# Patient Record
Sex: Female | Born: 1965 | Hispanic: Yes | Marital: Married | Smoking: Never smoker
Health system: Southern US, Community
[De-identification: ages and names within clinical notes are randomized; demographics above are authoritative.]

## PROBLEM LIST (undated history)

## (undated) DIAGNOSIS — R42 Dizziness and giddiness: Secondary | ICD-10-CM

## (undated) DIAGNOSIS — M1990 Unspecified osteoarthritis, unspecified site: Secondary | ICD-10-CM

## (undated) DIAGNOSIS — F32A Depression, unspecified: Secondary | ICD-10-CM

## (undated) DIAGNOSIS — G43909 Migraine, unspecified, not intractable, without status migrainosus: Secondary | ICD-10-CM

## (undated) HISTORY — DX: Unspecified osteoarthritis, unspecified site: M19.90

## (undated) HISTORY — DX: Depression, unspecified: F32.A

## (undated) HISTORY — PX: APPENDECTOMY: SHX54

---

## 2016-08-18 ENCOUNTER — Ambulatory Visit
Admission: EM | Admit: 2016-08-18 | Discharge: 2016-08-18 | Discharge status: Absent without leave | Payer: Medicaid Other

## 2016-08-18 ENCOUNTER — Encounter: Payer: Self-pay | Admitting: *Deleted

## 2016-08-18 HISTORY — DX: Migraine, unspecified, not intractable, without status migrainosus: G43.909

## 2016-08-18 HISTORY — DX: Dizziness and giddiness: R42

## 2016-08-18 NOTE — ED Triage Notes (Signed)
Patient started having symptoms of cough, body aches, and blisters on mouth 2 days ago.

## 2016-08-19 ENCOUNTER — Ambulatory Visit
Admission: EM | Admit: 2016-08-19 | Discharge: 2016-08-19 | Disposition: A | Discharge status: Home / Self Care | Payer: Medicaid Other | Attending: Family Medicine | Admitting: Family Medicine

## 2016-08-19 DIAGNOSIS — B001 Herpesviral vesicular dermatitis: Secondary | ICD-10-CM | POA: Diagnosis not present

## 2016-08-19 DIAGNOSIS — J069 Acute upper respiratory infection, unspecified: Secondary | ICD-10-CM | POA: Diagnosis not present

## 2016-08-19 MED ORDER — HYDROCOD POLST-CPM POLST ER 10-8 MG/5ML PO SUER: 5.0000 mL | Freq: Two times a day (BID) | ORAL | 0 refills | Status: DC

## 2016-08-19 MED ORDER — VALACYCLOVIR HCL 1 G PO TABS: 2000.0000 mg | ORAL_TABLET | Freq: Two times a day (BID) | ORAL | 0 refills | Status: AC

## 2016-08-19 NOTE — ED Triage Notes (Signed)
Pt c/o cough for 3 days, and mouth lesions.

## 2016-08-19 NOTE — ED Provider Notes (Signed)
CSN: [id number]     Arrival date & time 08/19/16  7564 History   First MD Initiated Contact with Patient 08/19/16 1028     Chief Complaint  Patient presents with  . Cough   (Consider location/radiation/quality/duration/timing/severity/associated sxs/prior Treatment) HPI  This a 51 year old female who presents with a  cough headache chills x 3 days. His noticed some "high back" pain when coughing. Addition she has noticed some cold sores are involving both the upper and lower lips. She states that after using Carmex lotion she had swelling of her lips with development of the sores      Past Medical History:  Diagnosis Date  . Migraine   . Vertigo    Past Surgical History:  Procedure Laterality Date  . APPENDECTOMY     Family History  Problem Relation Age of Onset  . Cancer Mother   . Cancer Father    Social History  Substance Use Topics  . Smoking status: Never Smoker  . Smokeless tobacco: Never Used  . Alcohol use No   OB History    No data available     Review of Systems  Constitutional: Positive for activity change. Negative for chills, fatigue and fever.  HENT: Positive for congestion and mouth sores.   Respiratory: Positive for cough. Negative for shortness of breath, wheezing and stridor.   All other systems reviewed and are negative.   Allergies  Patient has no known allergies.  Home Medications   Prior to Admission medications   Medication Sig Start Date End Date Taking? Authorizing Provider  chlorpheniramine-HYDROcodone (TUSSIONEX PENNKINETIC ER) 10-8 MG/5ML SUER Take 5 mLs by mouth 2 (two) times daily. 08/19/16   Lutricia Feil, PA-C  valACYclovir (VALTREX) 1000 MG tablet Take 2 tablets (2,000 mg total) by mouth 2 (two) times daily. For 1 day only 08/19/16 09/02/16  Lutricia Feil, PA-C   Meds Ordered and Administered this Visit  Medications - No data to display  BP 114/80 (BP Location: Left Arm)   Pulse 91   Temp 98.4 F (36.9 C) (Oral)   Resp  18   Ht 5\' 2"  (1.575 m)   Wt 220 lb (99.8 kg)   SpO2 100%   BMI 40.24 kg/m  No data found.   Physical Exam  Constitutional: She is oriented to person, place, and time. She appears well-developed and well-nourished. No distress.  HENT:  Head: Normocephalic and atraumatic.  Right Ear: External ear normal.  Left Ear: External ear normal.  Nose: Nose normal.  Mouth/Throat: Oropharynx is clear and moist. No oropharyngeal exudate.  Semination of the lips shows several vesicular lesions on an erythematous base. Is not Involve the vermilion border. There are several small ones on the upper lip and one confluent larger wound on the lower right. No discharge. Not open or draining.  Eyes: EOM are normal. Pupils are equal, round, and reactive to light. Right eye exhibits no discharge. Left eye exhibits no discharge.  Pulmonary/Chest: Effort normal. No respiratory distress. She has no wheezes. She has rales.  She has a few inspiratory rales in the right base  Musculoskeletal: Normal range of motion.  Neurological: She is alert and oriented to person, place, and time.  Skin: Skin is warm and dry. She is not diaphoretic.  Psychiatric: She has a normal mood and affect. Her behavior is normal. Judgment and thought content normal.  Nursing note and vitals reviewed.   Urgent Care Course     Procedures (including critical care time)  Labs Review Labs Reviewed - No data to display  Imaging Review No results found.   Visual Acuity Review  Right Eye Distance:   Left Eye Distance:   Bilateral Distance:    Right Eye Near:   Left Eye Near:    Bilateral Near:         MDM   1. Acute upper respiratory infection   2. Cold sore    Discharge Medication List as of 08/19/2016 10:48 AM    START taking these medications   Details  chlorpheniramine-HYDROcodone (TUSSIONEX PENNKINETIC ER) 10-8 MG/5ML SUER Take 5 mLs by mouth 2 (two) times daily., Starting Tue 08/19/2016, Print    valACYclovir  (VALTREX) 1000 MG tablet Take 2 tablets (2,000 mg total) by mouth 2 (two) times daily. For 1 day only, Starting Tue 08/19/2016, Until Tue 09/02/2016, Normal      Plan: 1. Test/x-ray results and diagnosis reviewed with patient 2. rx as per orders; risks, benefits, potential side effects reviewed with patient 3. Recommend supportive treatment with Rest and fluids. Advised her that this is likely a virus and does not require antibiotics. I have also advised her to stop using the Carmex as this may be causing a contact dermatitis on her lips. I will give her a prescription for Valtrex as some of the lesions appear to be a cold sore and may be beneficial. He should follow-up with primary care physician if she is not improving 4. F/u prn if symptoms worsen or don't improve     Lutricia FeilWilliam P Roemer, PA-C 08/19/16 [address]    William P Fort LoramieRoemer, [place] JerseyPA-C 08/19/16 1101

## 2016-09-08 ENCOUNTER — Ambulatory Visit
Admission: EM | Admit: 2016-09-08 | Discharge: 2016-09-08 | Disposition: A | Discharge status: Home / Self Care | Payer: Medicaid Other | Attending: Emergency Medicine | Admitting: Emergency Medicine

## 2016-09-08 ENCOUNTER — Encounter: Payer: Self-pay | Admitting: *Deleted

## 2016-09-08 DIAGNOSIS — J069 Acute upper respiratory infection, unspecified: Secondary | ICD-10-CM

## 2016-09-08 MED ORDER — BENZONATATE 200 MG PO CAPS: 200.0000 mg | ORAL_CAPSULE | Freq: Three times a day (TID) | ORAL | 0 refills | Status: DC | PRN

## 2016-09-08 MED ORDER — GUAIFENESIN-CODEINE 100-10 MG/5ML PO SYRP: 10.0000 mL | ORAL_SOLUTION | Freq: Four times a day (QID) | ORAL | 0 refills | Status: DC | PRN

## 2016-09-08 MED ORDER — GUAIFENESIN ER 600 MG PO TB12: 600.0000 mg | ORAL_TABLET | Freq: Two times a day (BID) | ORAL | 0 refills | Status: DC

## 2016-09-08 MED ORDER — IBUPROFEN 600 MG PO TABS: 600.0000 mg | ORAL_TABLET | Freq: Four times a day (QID) | ORAL | 0 refills | Status: DC | PRN

## 2016-09-08 MED ORDER — FLUTICASONE PROPIONATE 50 MCG/ACT NA SUSP: 2.0000 | Freq: Every day | NASAL | 0 refills | Status: DC

## 2016-09-08 NOTE — Discharge Instructions (Signed)
Take the medication as written.  You may take 600 mg of motrin with 1 gram of tylenol up to 4 times a day as needed for pain. This is an effective combination for pain.  Most of these infections are viral and do not need antibiotics unless you have a high fever, have had this for 10 days, or you get better and then get sick again. Use a neti pot or the NeilMed sinus rinse as often as you want to to reduce nasal congestion. Follow the directions on the box.   Go to www.goodrx.com to look up your medications. This will give you a list of where you can find your prescriptions at the most affordable prices.

## 2016-09-08 NOTE — ED Provider Notes (Signed)
HPI  SUBJECTIVE:  Amy Frey is a 51 y.o. female who presents with fevers tmax 102, cough productive of whitish phlegm, chest soreness from coughing, nasal congestion, clear rhinorrhea, postnasal drip, headaches, fatigue starting yesterday. No fevers today. She did take Tylenol 8 hours prior to evaluation. She denies body aches, ear pain, sinus pain or pressure, posttussive emesis, dental pain, sore throat.  She denies wheezing, chest pain, shortness of breath. No Neck stiffness, photophobia. No abdominal pain, urinary complaints. No skin infection. She tried Vicks vapo rub, Tylenol, ibuprofen over-the-counter cold medicines with improvement in her symptoms. No aggravating factors. She did not get a flu shot this year. She is not sure if she has had any contacts with the flu. She has a past medical history of migraines, vertigo. No history of lung disease, smoking, diabetes, hypertension, immunocompromise, cancer, kidney disease.. PMD: None.  Past Medical History:  Diagnosis Date  . Migraine   . Vertigo     Past Surgical History:  Procedure Laterality Date  . APPENDECTOMY      Family History  Problem Relation Age of Onset  . Cancer Mother   . Cancer Father     Social History  Substance Use Topics  . Smoking status: Never Smoker  . Smokeless tobacco: Never Used  . Alcohol use No    No current facility-administered medications for this encounter.   Current Outpatient Prescriptions:  .  benzonatate (TESSALON) 200 MG capsule, Take 1 capsule (200 mg total) by mouth 3 (three) times daily as needed for cough., Disp: 30 capsule, Rfl: 0 .  fluticasone (FLONASE) 50 MCG/ACT nasal spray, Place 2 sprays into both nostrils daily., Disp: 16 g, Rfl: 0 .  guaiFENesin-codeine (CHERATUSSIN AC) 100-10 MG/5ML syrup, Take 10 mLs by mouth 4 (four) times daily as needed for cough., Disp: 120 mL, Rfl: 0 .  ibuprofen (ADVIL,MOTRIN) 600 MG tablet, Take 1 tablet (600 mg total) by mouth every 6 (six)  hours as needed., Disp: 30 tablet, Rfl: 0  No Known Allergies   ROS  As noted in HPI.   Physical Exam  BP 116/81 (BP Location: Left Arm)   Pulse 76   Temp 97.9 F (36.6 C) (Oral)   Resp 16   Ht 5\' 2"  (1.575 m)   SpO2 99%   Constitutional: Well developed, well nourished, no acute distress Eyes: PERRL, EOMI, conjunctiva normal bilaterally HENT: Normocephalic, atraumatic,mucus membranes moist. TMs normal bilaterally. Positive clear rhinorrhea with swollen erythematous turbinates. No sinus tenderness. Slightly erythematous oropharynx with positive cobblestoning Neck: No meningismus, cervical lymphadenopathy Respiratory: Clear to auscultation bilaterally, no rales, no wheezing, no rhonchi Cardiovascular: Normal rate and rhythm, no murmurs, no gallops, no rubs GI: Soft, nondistended, normal bowel sounds, nontender, no rebound, no guarding Back: no CVAT skin: No rash, skin intact Musculoskeletal: No edema, no tenderness, no deformities Neurologic: Alert & oriented x 3, CN II-XII grossly intact, no motor deficits, sensation grossly intact Psychiatric: Speech and behavior appropriate   ED Course   Medications - No data to display  No orders of the defined types were placed in this encounter.  No results found for this or any previous visit (from the past 24 hour(s)). No results found.  ED Clinical Impression  Upper respiratory tract infection, unspecified type   ED Assessment/Plan  Presentation most consistent with a URI. Doubt influenza, otitis, pharyngitis, meningitis, sinusitis, pneumonia, intra-abdominal process or UTI at this point in time. His are completely clear, patient states that she has not had any fevers since  yesterday. Overall she appears well. Vitals are normal. Supportive treatment with Flonase, Mucinex D, saline nasal irrigation, ibuprofen 600 mg, Tessalon Perles and Cheratussin. Patient states that she was not able to afford the Tussionex at her last  visit.  We'll provide her a primary care referral for follow-up, but patient may return here as needed. Discussed signs and symptoms that should prompt return to the emergency department. Patient agrees with plan.   Meds ordered this encounter  Medications  . DISCONTD: guaiFENesin (MUCINEX) 600 MG 12 hr tablet    Sig: Take 1 tablet (600 mg total) by mouth 2 (two) times daily.    Dispense:  14 tablet    Refill:  0  . fluticasone (FLONASE) 50 MCG/ACT nasal spray    Sig: Place 2 sprays into both nostrils daily.    Dispense:  16 g    Refill:  0  . benzonatate (TESSALON) 200 MG capsule    Sig: Take 1 capsule (200 mg total) by mouth 3 (three) times daily as needed for cough.    Dispense:  30 capsule    Refill:  0  . guaiFENesin-codeine (CHERATUSSIN AC) 100-10 MG/5ML syrup    Sig: Take 10 mLs by mouth 4 (four) times daily as needed for cough.    Dispense:  120 mL    Refill:  0  . ibuprofen (ADVIL,MOTRIN) 600 MG tablet    Sig: Take 1 tablet (600 mg total) by mouth every 6 (six) hours as needed.    Dispense:  30 tablet    Refill:  0    *This clinic note was created using Scientist, clinical (histocompatibility and immunogenetics)Dragon dictation software. Therefore, there may be occasional mistakes despite careful proofreading.  ?   Domenick GongAshley Tamela Elsayed, MD 09/08/16 1724

## 2016-09-08 NOTE — ED Triage Notes (Signed)
Productive cough, fever, head and chest congestion, headaches since yesterday.

## 2017-01-01 DIAGNOSIS — G43709 Chronic migraine without aura, not intractable, without status migrainosus: Secondary | ICD-10-CM | POA: Insufficient documentation

## 2019-07-07 ENCOUNTER — Other Ambulatory Visit: Payer: Self-pay

## 2019-07-07 DIAGNOSIS — Z20822 Contact with and (suspected) exposure to covid-19: Secondary | ICD-10-CM

## 2019-07-08 LAB — NOVEL CORONAVIRUS, NAA: SARS-CoV-2, NAA: NOT DETECTED

## 2020-02-09 ENCOUNTER — Other Ambulatory Visit: Payer: Self-pay

## 2020-02-09 ENCOUNTER — Emergency Department (HOSPITAL_COMMUNITY): Payer: Medicaid Other

## 2020-02-09 ENCOUNTER — Emergency Department (HOSPITAL_COMMUNITY)
Admission: EM | Admit: 2020-02-09 | Discharge: 2020-02-10 | Disposition: A | Discharge status: Home / Self Care | Payer: Medicaid Other | Attending: Emergency Medicine | Admitting: Emergency Medicine

## 2020-02-09 DIAGNOSIS — R519 Headache, unspecified: Secondary | ICD-10-CM | POA: Insufficient documentation

## 2020-02-09 DIAGNOSIS — R0602 Shortness of breath: Secondary | ICD-10-CM | POA: Insufficient documentation

## 2020-02-09 DIAGNOSIS — R112 Nausea with vomiting, unspecified: Secondary | ICD-10-CM

## 2020-02-09 LAB — COMPREHENSIVE METABOLIC PANEL
ALT: 15 U/L (ref 0–44)
AST: 14 U/L — ABNORMAL LOW (ref 15–41)
Albumin: 3.6 g/dL (ref 3.5–5.0)
Alkaline Phosphatase: 65 U/L (ref 38–126)
Anion gap: 9 (ref 5–15)
BUN: 7 mg/dL (ref 6–20)
CO2: 25 mmol/L (ref 22–32)
Calcium: 8.9 mg/dL (ref 8.9–10.3)
Chloride: 104 mmol/L (ref 98–111)
Creatinine, Ser: 0.76 mg/dL (ref 0.44–1.00)
GFR calc Af Amer: >60 mL/min (ref >60)
GFR calc non Af Amer: >60 mL/min (ref >60)
Glucose, Bld: 98 mg/dL (ref 70–99)
Potassium: 4.3 mmol/L (ref 3.5–5.1)
Sodium: 138 mmol/L (ref 135–145)
Total Bilirubin: 0.5 mg/dL (ref 0.3–1.2)
Total Protein: 7.5 g/dL (ref 6.5–8.1)

## 2020-02-09 LAB — CBC WITH DIFFERENTIAL/PLATELET
Abs Immature Granulocytes: 0.03 10*3/uL (ref 0.00–0.07)
Basophils Absolute: 0 10*3/uL (ref 0.0–0.1)
Basophils Relative: 1 %
Eosinophils Absolute: 0.1 10*3/uL (ref 0.0–0.5)
Eosinophils Relative: 1 %
HCT: 37.5 % (ref 36.0–46.0)
Hemoglobin: 12.1 g/dL (ref 12.0–15.0)
Immature Granulocytes: 0 %
Lymphocytes Relative: 23 %
Lymphs Abs: 1.6 10*3/uL (ref 0.7–4.0)
MCH: 28.7 pg (ref 26.0–34.0)
MCHC: 32.3 g/dL (ref 30.0–36.0)
MCV: 89.1 fL (ref 80.0–100.0)
Monocytes Absolute: 0.4 10*3/uL (ref 0.1–1.0)
Monocytes Relative: 5 %
Neutro Abs: 4.9 10*3/uL (ref 1.7–7.7)
Neutrophils Relative %: 70 %
Platelets: 244 10*3/uL (ref 150–400)
RBC: 4.21 MIL/uL (ref 3.87–5.11)
RDW: 14.6 % (ref 11.5–15.5)
WBC: 7 10*3/uL (ref 4.0–10.5)
nRBC: 0 % (ref 0.0–0.2)

## 2020-02-09 LAB — TROPONIN I (HIGH SENSITIVITY)
Troponin I (High Sensitivity): 3 ng/L (ref <18)
Troponin I (High Sensitivity): 3 ng/L (ref <18)

## 2020-02-09 NOTE — ED Triage Notes (Signed)
C/o vomiting and shortness of breath since 5am and can't keep anything down. Stated she had Covid last May.

## 2020-02-10 MED ORDER — ONDANSETRON 4 MG PO TBDP
4.0000 mg | ORAL_TABLET | Freq: Once | ORAL | Status: AC
  Administered 2020-02-10: 02:00:00 4 mg via ORAL
  Filled 2020-02-10: qty 1

## 2020-02-10 MED ORDER — ONDANSETRON 4 MG PO TBDP: 4.0000 mg | ORAL_TABLET | Freq: Three times a day (TID) | ORAL | 0 refills | Status: AC | PRN

## 2020-02-10 NOTE — ED Provider Notes (Signed)
[facility] EMERGENCY DEPARTMENT Provider Note  CSN: [id number] Arrival date & time: 02/09/20 1839  Chief Complaint(s) Emesis and Shortness of Breath  HPI Amy Frey is a 54 y.o. female    Emesis Severity:  Moderate Duration:  20 hours Timing:  Intermittent Quality:  Stomach contents Progression:  Unchanged Chronicity:  New Recent urination:  Normal Relieved by:  Nothing Worsened by:  Liquids Associated symptoms: headaches (mild, generalized) and URI   Associated symptoms: no abdominal pain, no chills, no cough, no diarrhea, no fever, no myalgias and no sore throat   Risk factors: no alcohol use, no suspect food intake and no travel to endemic areas   Shortness of Breath Associated symptoms: headaches (mild, generalized) and vomiting   Associated symptoms: no abdominal pain, no cough, no fever and no sore throat    Reports this is similar to her Covid infection from last year.   Past Medical History Past Medical History:  Diagnosis Date  . Migraine   . Vertigo    There are no problems to display for this patient.  Home Medication(s) Prior to Admission medications   Medication Sig Start Date End Date Taking? Authorizing Provider  benzonatate (TESSALON) 200 MG capsule Take 1 capsule (200 mg total) by mouth 3 (three) times daily as needed for cough. 09/08/16   Domenick Gong, MD  fluticasone (FLONASE) 50 MCG/ACT nasal spray Place 2 sprays into both nostrils daily. 09/08/16   Domenick Gong, MD  guaiFENesin-codeine (CHERATUSSIN AC) 100-10 MG/5ML syrup Take 10 mLs by mouth 4 (four) times daily as needed for cough. 09/08/16   Domenick Gong, MD  ibuprofen (ADVIL,MOTRIN) 600 MG tablet Take 1 tablet (600 mg total) by mouth every 6 (six) hours as needed. 09/08/16   Domenick Gong, MD  ondansetron (ZOFRAN ODT) 4 MG disintegrating tablet Take 1 tablet (4 mg total) by mouth every 8 (eight) hours as needed for up to 3 days for nausea or vomiting.  02/10/20 02/13/20  Teresia Myint, Amadeo Garnet, MD                                                                                                                                    Past Surgical History Past Surgical History:  Procedure Laterality Date  . APPENDECTOMY     Family History Family History  Problem Relation Age of Onset  . Cancer Mother   . Cancer Father     Social History Social History   Tobacco Use  . Smoking status: Never Smoker  . Smokeless tobacco: Never Used  Substance Use Topics  . Alcohol use: No  . Drug use: No   Allergies Patient has no known allergies.  Review of Systems Review of Systems  Constitutional: Negative for chills and fever.  HENT: Negative for sore throat.   Respiratory: Positive for shortness of breath. Negative for cough.   Gastrointestinal: Positive for vomiting. Negative for abdominal pain and diarrhea.  Musculoskeletal: Negative for myalgias.  Neurological: Positive for headaches (mild, generalized).   All other systems are reviewed and are negative for acute change except as noted in the HPI  Physical Exam Vital Signs  I have reviewed the triage vital signs BP (!) 144/93 (BP Location: Left Arm)   Pulse 75   Temp 98.2 F (36.8 C) (Oral)   Resp 20   SpO2 100%   Physical Exam Vitals reviewed.  Constitutional:      General: She is not in acute distress.    Appearance: She is well-developed. She is not diaphoretic.  HENT:     Head: Normocephalic and atraumatic.     Nose: Nose normal.  Eyes:     General: No scleral icterus.       Right eye: No discharge.        Left eye: No discharge.     Conjunctiva/sclera: Conjunctivae normal.     Pupils: Pupils are equal, round, and reactive to light.  Cardiovascular:     Rate and Rhythm: Normal rate and regular rhythm.     Heart sounds: No murmur heard.  No friction rub. No gallop.   Pulmonary:     Effort: Pulmonary effort is normal. No respiratory distress.     Breath sounds:  Normal breath sounds. No stridor. No rales.  Abdominal:     General: There is no distension.     Palpations: Abdomen is soft.     Tenderness: There is no abdominal tenderness.  Musculoskeletal:        General: No tenderness.     Cervical back: Normal range of motion and neck supple.  Skin:    General: Skin is warm and dry.     Findings: No erythema or rash.  Neurological:     Mental Status: She is alert and oriented to person, place, and time.     ED Results and Treatments Labs (all labs ordered are listed, but only abnormal results are displayed) Labs Reviewed  COMPREHENSIVE METABOLIC PANEL - Abnormal; Notable for the following components:      Result Value   AST 14 (*)    All other components within normal limits  CBC WITH DIFFERENTIAL/PLATELET  TROPONIN I (HIGH SENSITIVITY)  TROPONIN I (HIGH SENSITIVITY)                                                                                                                         EKG  EKG Interpretation  Date/Time:  Friday February 10 2020 01:22:32 EDT Ventricular Rate:  79 PR Interval:  170 QRS Duration: 90 QT Interval:  426 QTC Calculation: 488 R Axis:     Text Interpretation: Normal sinus rhythm Prolonged QT Abnormal ECG NO STEMI. Confirmed by Drema Pryardama, Himmat Enberg [phone]([id number]) on 02/10/2020 1:57:17 AM      Radiology DG Chest 2 View  Result Date: 02/09/2020 CLINICAL DATA:  54 year old female with shortness of breath and vomiting since 0500 hours. Status post COVID-19 in May. EXAM: CHEST -  2 VIEW COMPARISON:  None. FINDINGS: Somewhat low lung volumes. Normal cardiac size and mediastinal contours. Visualized tracheal air column is within normal limits. Both lungs appear clear. No pneumothorax, pleural effusion or evidence of pneumoperitoneum. Visible bowel gas pattern is within normal limits. Mild dextroconvex thoracic scoliosis. No acute osseous abnormality identified. IMPRESSION: Low lung volumes.  No acute cardiopulmonary abnormality.  Electronically Signed   By: Odessa Fleming M.D.   On: 02/09/2020 19:39    Pertinent labs & imaging results that were available during my care of the patient were reviewed by me and considered in my medical decision making (see chart for details).  Medications Ordered in ED Medications  ondansetron (ZOFRAN-ODT) disintegrating tablet 4 mg (4 mg Oral Given 02/10/20 0222)                                                                                                                                    Procedures Procedures  (including critical care time)  Medical Decision Making / ED Course I have reviewed the nursing notes for this encounter and the patient's prior records (if available in EHR or on provided paperwork).   Amy Frey was evaluated in Emergency Department on 02/10/2020 for the symptoms described in the history of present illness. She was evaluated in the context of the global COVID-19 pandemic, which necessitated consideration that the patient might be at risk for infection with the SARS-CoV-2 virus that causes COVID-19. Institutional protocols and algorithms that pertain to the evaluation of patients at risk for COVID-19 are in a state of rapid change based on information released by regulatory bodies including the CDC and federal and state organizations. These policies and algorithms were followed during the patient's care in the ED.  Patient presents with 1 day of nausea and nonbloody nonbilious emesis.  Abdomen benign. Labs grossly reassuring without leukocytosis or anemia.  No significant electrolyte derangements or renal sufficiency. No biliary obstruction. Serious intra-abdominal medications infectious process or bowel obstruction.  EKG nonischemic. Trop neg x2. Doubt cardiac process.  We offered Covid test.  She declined.   ODT zofran given. Tolerating PO.      Final Clinical Impression(s) / ED Diagnoses Final diagnoses:  Nausea and vomiting in adult   The patient  appears reasonably screened and/or stabilized for discharge and I doubt any other medical condition or other [facility] requiring further screening, evaluation, or treatment in the ED at this time prior to discharge. Safe for discharge with strict return precautions.  Disposition: Discharge  Condition: Good  I have discussed the results, Dx and Tx plan with the patient/family who expressed understanding and agree(s) with the plan. Discharge instructions discussed at length. The patient/family was given strict return precautions who verbalized understanding of the instructions. No further questions at time of discharge.    ED Discharge Orders         Ordered    ondansetron (ZOFRAN ODT) 4 MG disintegrating  tablet  Every 8 hours PRN     Discontinue  Reprint     02/10/20 0311            Follow Up: [facility] [address] [place] [address] [phone]  Schedule an appointment as soon as possible for a visit  As needed      This chart was dictated using voice recognition software.  Despite best efforts to proofread,  errors can occur which can change the documentation meaning.   Nira Conn, MD 02/10/20 [phone]

## 2020-05-07 ENCOUNTER — Ambulatory Visit: Payer: Medicaid Other | Admitting: Family Medicine

## 2020-05-21 ENCOUNTER — Ambulatory Visit: Payer: Medicaid Other | Admitting: Family Medicine

## 2020-08-15 ENCOUNTER — Ambulatory Visit (HOSPITAL_COMMUNITY)
Admission: EM | Admit: 2020-08-15 | Discharge: 2020-08-15 | Disposition: A | Discharge status: Home / Self Care | Payer: HRSA Program | Attending: Family Medicine | Admitting: Family Medicine

## 2020-08-15 ENCOUNTER — Other Ambulatory Visit: Payer: Self-pay

## 2020-08-15 ENCOUNTER — Encounter (HOSPITAL_COMMUNITY): Payer: Self-pay

## 2020-08-15 DIAGNOSIS — J069 Acute upper respiratory infection, unspecified: Secondary | ICD-10-CM | POA: Diagnosis present

## 2020-08-15 DIAGNOSIS — Z20822 Contact with and (suspected) exposure to covid-19: Secondary | ICD-10-CM

## 2020-08-15 DIAGNOSIS — U071 COVID-19: Secondary | ICD-10-CM | POA: Diagnosis not present

## 2020-08-15 LAB — SARS CORONAVIRUS 2 (TAT 6-24 HRS): SARS Coronavirus 2: POSITIVE — AB

## 2020-08-15 NOTE — ED Triage Notes (Signed)
Patient presents to Urgent Care with complaints of cough, congestion 01/13. Patients son tested positive pt req covid test for work. Treating symptoms with Nyquil and Dayquil.   Denies fever, abdominal pain, n/v, or diarrhea.

## 2020-08-15 NOTE — Discharge Instructions (Signed)
You have been tested for COVID-19 today. °If your test returns positive, you will receive a phone call from [facility] regarding your results. °Negative test results are not called. °Both positive and negative results area always visible on MyChart. °If you do not have a MyChart account, sign up instructions are provided in your discharge papers. °Please do not hesitate to contact us should you have questions or concerns. ° °

## 2020-08-15 NOTE — ED Provider Notes (Signed)
  [facility]   [id number] 08/15/20 Arrival Time: 1220  ASSESSMENT & PLAN:  1. Viral URI with cough   2. Close exposure to COVID-19 virus      COVID-19 testing sent. See letter/work note on file for self-isolation guidelines. OTC symptom care as needed.  May f/u as needed.  Reviewed expectations re: course of current medical issues. Questions answered. Outlined signs and symptoms indicating need for more acute intervention. Understanding verbalized. After Visit Summary given.   SUBJECTIVE: History from: patient. Kyndal Heringer is a 55 y.o. female who presents with worries regarding COVID-19. Known COVID-19 contact: son. Recent travel: none. Reports: cough, nasal congestion x 4-5 days. Denies: fever and difficulty breathing. Normal PO intake without n/v/d.    OBJECTIVE:  Vitals:   08/15/20 1411 08/15/20 1416  BP:  127/85  Pulse:  72  Resp:  16  Temp:  98.4 F (36.9 C)  TempSrc:  Oral  SpO2:  100%  Weight: 99.8 kg     General appearance: alert; no distress Eyes: PERRLA; EOMI; conjunctiva normal HENT: [place]; AT; with nasal congestion Neck: supple  Lungs: speaks full sentences without difficulty; unlabored; dry cough Extremities: no edema Skin: warm and dry Neurologic: normal gait Psychological: alert and cooperative; normal mood and affect  Labs:  Labs Reviewed  SARS CORONAVIRUS 2 (TAT 6-24 HRS)     No Known Allergies  Past Medical History:  Diagnosis Date  . Migraine   . Vertigo    Social History   Socioeconomic History  . Marital status: Married    Spouse name: Not on file  . Number of children: Not on file  . Years of education: Not on file  . Highest education level: Not on file  Occupational History  . Not on file  Tobacco Use  . Smoking status: Never Smoker  . Smokeless tobacco: Never Used  Substance and Sexual Activity  . Alcohol use: No  . Drug use: No  . Sexual activity: Not on file  Other Topics Concern  . Not on file   Social History Narrative  . Not on file   Social Determinants of Health   Financial Resource Strain: Not on file  Food Insecurity: Not on file  Transportation Needs: Not on file  Physical Activity: Not on file  Stress: Not on file  Social Connections: Not on file  Intimate Partner Violence: Not on file   Family History  Problem Relation Age of Onset  . Healthy Mother   . Heart failure Father    Past Surgical History:  Procedure Laterality Date  . Christy Gentles, MD 08/15/20 1501

## 2020-08-16 ENCOUNTER — Telehealth: Payer: Self-pay

## 2020-08-16 NOTE — Telephone Encounter (Signed)
Called to discuss with patient about COVID-19 symptoms and the use of one of the available treatments for those with mild to moderate Covid symptoms and at a high risk of hospitalization.  Pt appears to qualify for outpatient treatment due to co-morbid conditions and/or a member of an at-risk group in accordance with the FDA Emergency Use Authorization.    Symptom onset: 08/10/20 per chart Vaccinated: Unknown Booster? Unknown Immunocompromised? No Qualifiers: None  Unable to reach pt - No answer.  Esther Hardy

## 2021-10-15 ENCOUNTER — Encounter (HOSPITAL_COMMUNITY): Payer: Self-pay | Admitting: Emergency Medicine

## 2021-10-15 ENCOUNTER — Ambulatory Visit (HOSPITAL_COMMUNITY)
Admission: EM | Admit: 2021-10-15 | Discharge: 2021-10-15 | Disposition: A | Discharge status: Home / Self Care | Payer: Self-pay | Attending: Physician Assistant | Admitting: Physician Assistant

## 2021-10-15 DIAGNOSIS — J069 Acute upper respiratory infection, unspecified: Secondary | ICD-10-CM

## 2021-10-15 DIAGNOSIS — R0981 Nasal congestion: Secondary | ICD-10-CM

## 2021-10-15 DIAGNOSIS — J029 Acute pharyngitis, unspecified: Secondary | ICD-10-CM | POA: Insufficient documentation

## 2021-10-15 DIAGNOSIS — Z28310 Unvaccinated for covid-19: Secondary | ICD-10-CM | POA: Insufficient documentation

## 2021-10-15 DIAGNOSIS — R5381 Other malaise: Secondary | ICD-10-CM | POA: Insufficient documentation

## 2021-10-15 DIAGNOSIS — Z20822 Contact with and (suspected) exposure to covid-19: Secondary | ICD-10-CM | POA: Insufficient documentation

## 2021-10-15 DIAGNOSIS — R6883 Chills (without fever): Secondary | ICD-10-CM

## 2021-10-15 LAB — POC INFLUENZA A AND B ANTIGEN (URGENT CARE ONLY)
INFLUENZA A ANTIGEN, POC: NEGATIVE
INFLUENZA B ANTIGEN, POC: NEGATIVE

## 2021-10-15 MED ORDER — PROMETHAZINE-DM 6.25-15 MG/5ML PO SYRP: 5.0000 mL | ORAL_SOLUTION | Freq: Two times a day (BID) | ORAL | 0 refills | Status: DC | PRN

## 2021-10-15 MED ORDER — FLUTICASONE PROPIONATE 50 MCG/ACT NA SUSP: 2.0000 | Freq: Every day | NASAL | 0 refills | Status: DC

## 2021-10-15 NOTE — ED Triage Notes (Signed)
Pt does not presents in distress.  ?

## 2021-10-15 NOTE — ED Triage Notes (Signed)
Pt having congestion, facial pain, sore throat, body aches that got worse today.  ?

## 2021-10-15 NOTE — ED Provider Notes (Signed)
?[place] ? ? ? ?CSN: [id number] ?Arrival date & time: 10/15/21  1121 ? ? ?  ? ?History   ?Chief Complaint ?Chief Complaint  ?Patient presents with  ? Nasal Congestion  ? Facial Pain  ? ? ?HPI ?Amy Frey is a 56 y.o. female.  ? ?Patient presents today with a 24-hour history of URI symptoms.  Reports congestion, sinus pressure, sore throat, chills, body aches, fatigue, malaise.  Denies any fever, cough, chest pain, shortness of breath, nausea, vomiting.  Reports her husband has been sick but has not been evaluated to determine what is causing his symptoms.  She does have a history of allergies but states current symptoms are more severe than previous episodes of allergies.  She has tried taking Claritin without improvement of symptoms.  She has had COVID several years ago.  She has not had COVID-19 vaccine.  She does not smoke.  Denies any history of asthma, COPD, diabetes, immunosuppression.  Denies any recent antibiotic use. ? ? ?Past Medical History:  ?Diagnosis Date  ? Migraine   ? Vertigo   ? ? ?There are no problems to display for this patient. ? ? ?Past Surgical History:  ?Procedure Laterality Date  ? APPENDECTOMY    ? ? ?OB History   ?No obstetric history on file. ?  ? ? ? ?Home Medications   ? ?Prior to Admission medications   ?Medication Sig Start Date End Date Taking? Authorizing Provider  ?promethazine-dextromethorphan (PROMETHAZINE-DM) 6.25-15 MG/5ML syrup Take 5 mLs by mouth 2 (two) times daily as needed for cough. 10/15/21  Yes Tykera Skates K, PA-C  ?fluticasone (FLONASE) 50 MCG/ACT nasal spray Place 2 sprays into both nostrils daily. 10/15/21   Carolynne Schuchard, Derry Skill, PA-C  ?ibuprofen (ADVIL,MOTRIN) 600 MG tablet Take 1 tablet (600 mg total) by mouth every 6 (six) hours as needed. 09/08/16   Melynda Ripple, MD  ? ? ?Family History ?Family History  ?Problem Relation Age of Onset  ? Healthy Mother   ? Heart failure Father   ? ? ?Social History ?Social History  ? ?Tobacco Use  ? Smoking status:  Never  ? Smokeless tobacco: Never  ?Substance Use Topics  ? Alcohol use: No  ? Drug use: No  ? ? ? ?Allergies   ?Patient has no known allergies. ? ? ?Review of Systems ?Review of Systems  ?Constitutional:  Positive for activity change, chills and fatigue. Negative for appetite change and fever.  ?HENT:  Positive for congestion, sinus pressure and sore throat. Negative for sneezing.   ?Respiratory:  Negative for cough and shortness of breath.   ?Cardiovascular:  Negative for chest pain.  ?Gastrointestinal:  Negative for abdominal pain, diarrhea, nausea and vomiting.  ?Musculoskeletal:  Positive for arthralgias and myalgias.  ?Neurological:  Negative for dizziness, light-headedness and headaches.  ? ? ?Physical Exam ?Triage Vital Signs ?ED Triage Vitals  ?Enc Vitals Group  ?   BP 10/15/21 1148 (!) 126/91  ?   Pulse Rate 10/15/21 1146 96  ?   Resp 10/15/21 1146 17  ?   Temp 10/15/21 1146 98.9 ?F (37.2 ?C)  ?   Temp Source 10/15/21 1146 Oral  ?   SpO2 10/15/21 1146 100 %  ?   Weight --   ?   Height --   ?   Head Circumference --   ?   Peak Flow --   ?   Pain Score 10/15/21 1248 10  ?   Pain Loc --   ?  Pain Edu? --   ?   Excl. in [place]? --   ? ?No data found. ? ?Updated Vital Signs ?BP 125/88 (BP Location: Left Arm)   Pulse 90   Temp 98.9 ?F (37.2 ?C) (Oral)   Resp 17   SpO2 100%  ? ?Visual Acuity ?Right Eye Distance:   ?Left Eye Distance:   ?Bilateral Distance:   ? ?Right Eye Near:   ?Left Eye Near:    ?Bilateral Near:    ? ?Physical Exam ?Vitals reviewed.  ?Constitutional:   ?   General: She is awake. She is not in acute distress. ?   Appearance: Normal appearance. She is well-developed. She is not ill-appearing.  ?   Comments: Very pleasant female appears stated age in no acute distress sitting comfortably in exam room  ?HENT:  ?   Head: Normocephalic and atraumatic.  ?   Right Ear: Tympanic membrane, ear canal and external ear normal. Tympanic membrane is not erythematous or bulging.  ?   Left Ear: Tympanic  membrane, ear canal and external ear normal. Tympanic membrane is not erythematous or bulging.  ?   Nose:  ?   Right Sinus: Maxillary sinus tenderness present. No frontal sinus tenderness.  ?   Left Sinus: Maxillary sinus tenderness present. No frontal sinus tenderness.  ?   Mouth/Throat:  ?   Pharynx: Uvula midline. Posterior oropharyngeal erythema present. No oropharyngeal exudate.  ?Cardiovascular:  ?   Rate and Rhythm: Normal rate and regular rhythm.  ?   Heart sounds: Normal heart sounds, S1 normal and S2 normal. No murmur heard. ?Pulmonary:  ?   Effort: Pulmonary effort is normal.  ?   Breath sounds: Normal breath sounds. No wheezing, rhonchi or rales.  ?   Comments: Clear to auscultation bilaterally ?Psychiatric:     ?   Behavior: Behavior is cooperative.  ? ? ? ?UC Treatments / Results  ?Labs ?(all labs ordered are listed, but only abnormal results are displayed) ?Labs Reviewed  ?SARS CORONAVIRUS 2 (TAT 6-24 HRS)  ?POC INFLUENZA A AND B ANTIGEN (URGENT CARE ONLY)  ? ? ?EKG ? ? ?Radiology ?No results found. ? ?Procedures ?Procedures (including critical care time) ? ?Medications Ordered in UC ?Medications - No data to display ? ?Initial Impression / Assessment and Plan / UC Course  ?I have reviewed the triage vital signs and the nursing notes. ? ?Pertinent labs & imaging results that were available during my care of the patient were reviewed by me and considered in my medical decision making (see chart for details). ? ?  ? ?Patient is well-appearing, afebrile, nontoxic, nontachycardic.  Flu test was negative in clinic.  COVID testing is pending.  She was provided work excuse note with current CDC return to work guidelines based on COVID test result.  No evidence of acute infection on physical exam that would warrant initiation of antibiotics.  Recommended over-the-counter medications including alternate Tylenol and ibuprofen for pain relief.  Encouraged her to continue Claritin and add Flonase for congestion.   She was prescribed Promethazine DM for cough discussed that this can be sedating so she is not to drive or drink alcohol while taking it.  She is to rest and drink plenty of fluid.  Discussed alarm symptoms that warrant emergent evaluation including chest pain, shortness of breath, high fever not responding to medication, nausea/vomiting interfere with oral intake.  Strict return precautions given to which she expressed understanding. ? ?Final Clinical Impressions(s) / UC Diagnoses  ? ?Final  diagnoses:  ?Upper respiratory tract infection, unspecified type  ?Chills  ?Nasal congestion  ? ? ? ?Discharge Instructions   ? ?  ?Your flu testing was negative.  Your COVID testing is pending.  We will contact you if this is positive.  Please monitor your MyChart for these results.  Please continue using over-the-counter medications including antihistamine (Claritin) as well as Flonase for congestion.  I have called in Promethazine DM for cough.  This will make you sleepy so do not drive or drink alcohol while taking it.  Make sure you rest and drink plenty of fluid.  If your symptoms or not improving by next week or if at any point anything worsens you develop high fever, chest pain, shortness of breath, nausea/vomiting interfering with oral intake, weakness you need to go to the emergency room. ? ? ? ? ?ED Prescriptions   ? ? Medication Sig Dispense Auth. Provider  ? fluticasone (FLONASE) 50 MCG/ACT nasal spray Place 2 sprays into both nostrils daily. 16 g Sumayya Muha K, PA-C  ? promethazine-dextromethorphan (PROMETHAZINE-DM) 6.25-15 MG/5ML syrup Take 5 mLs by mouth 2 (two) times daily as needed for cough. 118 mL Jacquelynne Guedes K, PA-C  ? ?  ? ?PDMP not reviewed this encounter. ?  ?Terrilee Croak, PA-C ?10/15/21 1351 ? ?

## 2021-10-15 NOTE — Discharge Instructions (Addendum)
Your flu testing was negative.  Your COVID testing is pending.  We will contact you if this is positive.  Please monitor your MyChart for these results.  Please continue using over-the-counter medications including antihistamine (Claritin) as well as Flonase for congestion.  I have called in Promethazine DM for cough.  This will make you sleepy so do not drive or drink alcohol while taking it.  Make sure you rest and drink plenty of fluid.  If your symptoms or not improving by next week or if at any point anything worsens you develop high fever, chest pain, shortness of breath, nausea/vomiting interfering with oral intake, weakness you need to go to the emergency room. ?

## 2021-10-16 LAB — SARS CORONAVIRUS 2 (TAT 6-24 HRS): SARS Coronavirus 2: NEGATIVE

## 2022-01-14 ENCOUNTER — Ambulatory Visit: Payer: Medicaid Other | Attending: Critical Care Medicine | Admitting: Critical Care Medicine

## 2022-01-14 ENCOUNTER — Encounter: Payer: Self-pay | Admitting: Critical Care Medicine

## 2022-01-14 VITALS — BP 115/83 | HR 75 | Ht 60.0 in | Wt 190.4 lb

## 2022-01-14 DIAGNOSIS — Z139 Encounter for screening, unspecified: Secondary | ICD-10-CM

## 2022-01-14 DIAGNOSIS — Z6837 Body mass index (BMI) 37.0-37.9, adult: Secondary | ICD-10-CM

## 2022-01-14 DIAGNOSIS — Z79899 Other long term (current) drug therapy: Secondary | ICD-10-CM | POA: Insufficient documentation

## 2022-01-14 DIAGNOSIS — R5383 Other fatigue: Secondary | ICD-10-CM

## 2022-01-14 DIAGNOSIS — R202 Paresthesia of skin: Secondary | ICD-10-CM | POA: Insufficient documentation

## 2022-01-14 DIAGNOSIS — E559 Vitamin D deficiency, unspecified: Secondary | ICD-10-CM | POA: Insufficient documentation

## 2022-01-14 DIAGNOSIS — E669 Obesity, unspecified: Secondary | ICD-10-CM | POA: Insufficient documentation

## 2022-01-14 DIAGNOSIS — Z87891 Personal history of nicotine dependence: Secondary | ICD-10-CM | POA: Insufficient documentation

## 2022-01-14 DIAGNOSIS — Z1211 Encounter for screening for malignant neoplasm of colon: Secondary | ICD-10-CM

## 2022-01-14 DIAGNOSIS — Z713 Dietary counseling and surveillance: Secondary | ICD-10-CM | POA: Insufficient documentation

## 2022-01-14 DIAGNOSIS — Z1231 Encounter for screening mammogram for malignant neoplasm of breast: Secondary | ICD-10-CM | POA: Insufficient documentation

## 2022-01-14 DIAGNOSIS — Z9049 Acquired absence of other specified parts of digestive tract: Secondary | ICD-10-CM | POA: Insufficient documentation

## 2022-01-14 DIAGNOSIS — R0683 Snoring: Secondary | ICD-10-CM | POA: Insufficient documentation

## 2022-01-14 DIAGNOSIS — Z8249 Family history of ischemic heart disease and other diseases of the circulatory system: Secondary | ICD-10-CM | POA: Insufficient documentation

## 2022-01-14 DIAGNOSIS — Z1159 Encounter for screening for other viral diseases: Secondary | ICD-10-CM

## 2022-01-14 DIAGNOSIS — Z114 Encounter for screening for human immunodeficiency virus [HIV]: Secondary | ICD-10-CM | POA: Insufficient documentation

## 2022-01-14 NOTE — Patient Instructions (Addendum)
Complete screening labs today  No new medications  A pap smear appt will be made with one of the providers  Mammogram will be obtained via scholarship  Please apply for [facility] discount and orange card  Follow lifestyle medicine recommendations  Return Dr Joya Gaskins 4 months  Process colon cancer screen kit given

## 2022-01-14 NOTE — Assessment & Plan Note (Addendum)
Patient having trouble staying asleep at night. Excessively snoring with fatigue during the day. Thyroid panel ordered to rule out endocrinology pathology. Will consider at home sleep study at follow up. May have sleep apnea would benefit from home sleep study will need to get orange card first

## 2022-01-14 NOTE — Assessment & Plan Note (Signed)
Patient presenting with bilateral upper and lower extremity paraesthesia. Hemoglobin A1C, Complete blood count, and Vitamin levels ordered to rule out anemia, vitamin deficiency, and diabetic neuropathy. Will consider other pathology and referral to Neurology for condition if lab work unremarkable.

## 2022-01-14 NOTE — Assessment & Plan Note (Addendum)
Counseled patient on necessary changes to dietary and activity levels. This included eating more vegetables, fruits, lean meats, limiting salt intake and exercising 30 minutes daily 5x a week.

## 2022-01-14 NOTE — Progress Notes (Signed)
New Patient Office Visit  Subjective    Patient ID: Amy Frey, female    DOB: 1965/09/20  Age: 56 y.o. MRN: [id number]  CC:  Chief Complaint  Patient presents with   New Patient (Initial Visit)    HPI Libby Goehring  is a well appearing 56 year female with a history of migraines and depression presents to establish care. Accompanied by her daughter. She states she has not seen a provider in over 5 years. Today she complains of intermittent pins and needles that occur in her hands and feet. She states the symptoms appear randomly and at times she will "shake it out" to make it go away. In the past she was told her vitamin levels were low. Denies any trauma to the areas.  She is also experiencing trouble at night sleeping. She states she has no problem falling asleep but will routinely wake up in the middle of the night causing her to be fatigued. Her family member states that she does have excessive snoring.   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 She states her jobs is very physical and requires a lot of heavy lifting. She is married and lives at home with her husband with 7 children. She has excellent social support. Admits to marijuana use, drinks socially.  Patient is requesting blood work and cancer screenings due to family history.   Outpatient Encounter Medications as of 01/14/2022  Medication Sig   ibuprofen (ADVIL) 200 MG tablet Take 200 mg by mouth every 6 (six) hours as needed.   Multiple Vitamin (MULTIVITAMIN WITH MINERALS) TABS tablet Take 1 tablet by mouth daily.   [DISCONTINUED] fluticasone (FLONASE) 50 MCG/ACT nasal spray Place 2 sprays into both nostrils daily. (Patient not taking: Reported on 01/14/2022)   [DISCONTINUED] ibuprofen (ADVIL,MOTRIN) 600 MG tablet Take 1 tablet (600 mg total) by mouth every 6 (six) hours as needed. (Patient not taking: Reported on 01/14/2022)   [DISCONTINUED] promethazine-dextromethorphan (PROMETHAZINE-DM) 6.25-15  MG/5ML syrup Take 5 mLs by mouth 2 (two) times daily as needed for cough. (Patient not taking: Reported on 01/14/2022)   [DISCONTINUED] SUMAtriptan (IMITREX) 25 MG tablet TAKE 1 TABLET BY MOUTH ONCE AS NEEDED FOR MIGRAINE FOR UP TO 1 DOSE...MAY TAKE A SECOND DOSE AFTER 2 HOURS IF NEEDED (Patient not taking: Reported on 01/14/2022)   No facility-administered encounter medications on file as of 01/14/2022.    Past Medical History:  Diagnosis Date   Arthritis    Depression    Migraine    Vertigo     Past Surgical History:  Procedure Laterality Date   APPENDECTOMY      Family History  Problem Relation Age of Onset   Healthy Mother    Heart failure Father     Social History   Socioeconomic History   Marital status: Married    Spouse name: Not on file   Number of children: Not on file   Years of education: Not on file   Highest education level: Not on file  Occupational History   Not on file  Tobacco Use   Smoking status: Never   Smokeless tobacco: Never  Vaping Use   Vaping Use: Former  Substance and Sexual Activity   Alcohol use: Yes    Alcohol/week: 1.0 - 2.0 standard drink of alcohol    Types: 1 - 2 Glasses of wine per week    Comment: somtimes on stressful days or partys   Drug use: Yes    Types:  Marijuana   Sexual activity: Yes  Other Topics Concern   Not on file  Social History Narrative   Not on file   Social Determinants of Health   Financial Resource Strain: Not on file  Food Insecurity: Not on file  Transportation Needs: Not on file  Physical Activity: Not on file  Stress: Not on file  Social Connections: Not on file  Intimate Partner Violence: Not on file    Review of Systems  Constitutional: Negative.   HENT: Negative.    Eyes: Negative.   Respiratory: Negative.    Cardiovascular: Negative.   Gastrointestinal: Negative.   Genitourinary: Negative.   Musculoskeletal: Negative.   Skin: Negative.   Neurological: Negative.    Endo/Heme/Allergies: Negative.   Psychiatric/Behavioral:  Positive for depression.         Objective    BP 115/83   Pulse 75   Ht 5' (1.524 m)   Wt 190 lb 6.4 oz (86.4 kg)   SpO2 97%   BMI 37.18 kg/m   Physical Exam Constitutional:      Appearance: Normal appearance. She is obese.  HENT:     Right Ear: Tympanic membrane and ear canal normal.     Left Ear: Tympanic membrane and ear canal normal.     Nose: Nose normal.     Mouth/Throat:     Mouth: Mucous membranes are moist.     Pharynx: Oropharynx is clear.  Cardiovascular:     Rate and Rhythm: Normal rate and regular rhythm.     Pulses: Normal pulses.     Heart sounds: Normal heart sounds.  Pulmonary:     Effort: Pulmonary effort is normal.     Breath sounds: Normal breath sounds.  Abdominal:     General: Abdomen is flat. Bowel sounds are normal.     Palpations: Abdomen is soft.  Skin:    General: Skin is warm and dry.  Neurological:     Mental Status: She is alert.  Psychiatric:        Mood and Affect: Mood normal.        Behavior: Behavior normal.         Assessment & Plan:   Problem List Items Addressed This Visit       Other   Paresthesia - Primary    Patient presenting with bilateral upper and lower extremity paraesthesia. Hemoglobin A1C, Complete blood count, and Vitamin levels ordered to rule out anemia, vitamin deficiency, and diabetic neuropathy. Will consider other pathology and referral to Neurology for condition if lab work unremarkable.      Fatigue    Patient having trouble staying asleep at night. Excessively snoring with fatigue during the day. Thyroid panel ordered to rule out endocrinology pathology. Will consider at home sleep study at follow up. May have sleep apnea would benefit from home sleep study will need to get orange card first      BMI 37.0-37.9, adult    Counseled patient on necessary changes to dietary and activity levels. This included eating more vegetables, fruits,  lean meats, limiting salt intake and exercising 30 minutes daily 5x a week.       Other Visit Diagnoses     Encounter for screening for HIV       Relevant Orders   HIV Antibody (routine testing w rflx)   Need for hepatitis C screening test       Relevant Orders   HCV Ab w Reflex to Quant PCR   Colon cancer  screening       Relevant Orders   Fecal occult blood, imunochemical   Encounter for screening mammogram for malignant neoplasm of breast       Relevant Orders   MM DIGITAL SCREENING BILATERAL   VITAMIN D 25 Hydroxy (Vit-D Deficiency, Fractures)   Encounter for health-related screening       Relevant Orders   Lipid panel   Comprehensive metabolic panel   CBC with Differential/Platelet   Vitamin B1   Vitamin B12   Thyroid Panel With TSH   Hemoglobin A1c     Encourage patient to receive application for orange card [place] discount, if she receives this we can consider an evaluation for sleep apnea We will proceed with HIV hepatitis C screening we will order mammogram by way of the scholarship program and will issue a fecal occult kit for colon cancer screening Patient be brought back for a Pap smear with another partner  Return in about 4 months (around 05/16/2022).   Asencion Noble, MD

## 2022-01-17 ENCOUNTER — Telehealth: Payer: Self-pay

## 2022-01-17 LAB — COMPREHENSIVE METABOLIC PANEL
ALT: 12 IU/L (ref 0–32)
AST: 15 IU/L (ref 0–40)
Albumin/Globulin Ratio: 1.5 (ref 1.2–2.2)
Albumin: 4.4 g/dL (ref 3.8–4.9)
Alkaline Phosphatase: 64 IU/L (ref 44–121)
BUN/Creatinine Ratio: 18 (ref 9–23)
BUN: 11 mg/dL (ref 6–24)
Bilirubin Total: 0.4 mg/dL (ref 0.0–1.2)
CO2: 24 mmol/L (ref 20–29)
Calcium: 9.6 mg/dL (ref 8.7–10.2)
Chloride: 99 mmol/L (ref 96–106)
Creatinine, Ser: 0.61 mg/dL (ref 0.57–1.00)
Globulin, Total: 2.9 g/dL (ref 1.5–4.5)
Glucose: 85 mg/dL (ref 70–99)
Potassium: 4.1 mmol/L (ref 3.5–5.2)
Sodium: 137 mmol/L (ref 134–144)
Total Protein: 7.3 g/dL (ref 6.0–8.5)
eGFR: 104 mL/min/{1.73_m2} (ref >59)

## 2022-01-17 LAB — CBC WITH DIFFERENTIAL/PLATELET
Basophils Absolute: 0.1 10*3/uL (ref 0.0–0.2)
Basos: 1 %
EOS (ABSOLUTE): 0.2 10*3/uL (ref 0.0–0.4)
Eos: 3 %
Hematocrit: 37.9 % (ref 34.0–46.6)
Hemoglobin: 12.9 g/dL (ref 11.1–15.9)
Immature Grans (Abs): 0 10*3/uL (ref 0.0–0.1)
Immature Granulocytes: 0 %
Lymphocytes Absolute: 2.2 10*3/uL (ref 0.7–3.1)
Lymphs: 36 %
MCH: 30.6 pg (ref 26.6–33.0)
MCHC: 34 g/dL (ref 31.5–35.7)
MCV: 90 fL (ref 79–97)
Monocytes Absolute: 0.4 10*3/uL (ref 0.1–0.9)
Monocytes: 6 %
Neutrophils Absolute: 3.2 10*3/uL (ref 1.4–7.0)
Neutrophils: 54 %
Platelets: 218 10*3/uL (ref 150–450)
RBC: 4.22 x10E6/uL (ref 3.77–5.28)
RDW: 13.4 % (ref 11.7–15.4)
WBC: 5.9 10*3/uL (ref 3.4–10.8)

## 2022-01-17 LAB — LIPID PANEL
Chol/HDL Ratio: 2.6 ratio (ref 0.0–4.4)
Cholesterol, Total: 237 mg/dL — ABNORMAL HIGH (ref 100–199)
HDL: 91 mg/dL (ref >39)
LDL Chol Calc (NIH): 133 mg/dL — ABNORMAL HIGH (ref 0–99)
Triglycerides: 78 mg/dL (ref 0–149)
VLDL Cholesterol Cal: 13 mg/dL (ref 5–40)

## 2022-01-17 LAB — HCV AB W REFLEX TO QUANT PCR: HCV Ab: NONREACTIVE

## 2022-01-17 LAB — THYROID PANEL WITH TSH
Free Thyroxine Index: 1.8 (ref 1.2–4.9)
T3 Uptake Ratio: 25 % (ref 24–39)
T4, Total: 7.2 ug/dL (ref 4.5–12.0)
TSH: 1.38 u[IU]/mL (ref 0.450–4.500)

## 2022-01-17 LAB — VITAMIN D 25 HYDROXY (VIT D DEFICIENCY, FRACTURES): Vit D, 25-Hydroxy: 32.8 ng/mL (ref 30.0–100.0)

## 2022-01-17 LAB — VITAMIN B12: Vitamin B-12: 419 pg/mL (ref 232–1245)

## 2022-01-17 LAB — HEMOGLOBIN A1C
Est. average glucose Bld gHb Est-mCnc: 105 mg/dL
Hgb A1c MFr Bld: 5.3 % (ref 4.8–5.6)

## 2022-01-17 LAB — HCV INTERPRETATION

## 2022-01-17 LAB — HIV ANTIBODY (ROUTINE TESTING W REFLEX): HIV Screen 4th Generation wRfx: NONREACTIVE

## 2022-01-17 LAB — VITAMIN B1: Thiamine: 102.8 nmol/L (ref 66.5–200.0)

## 2022-01-17 IMAGING — DX DG CHEST 2V
2 series · 2 of 2 positions shown · non-contrast
Comparison: None.

CLINICAL DATA: 54-year-old female with shortness of breath and
vomiting since 3833 hours. Status post [id number]-MV [REDACTED].

EXAM:
CHEST - 2 VIEW

[w chest pa]
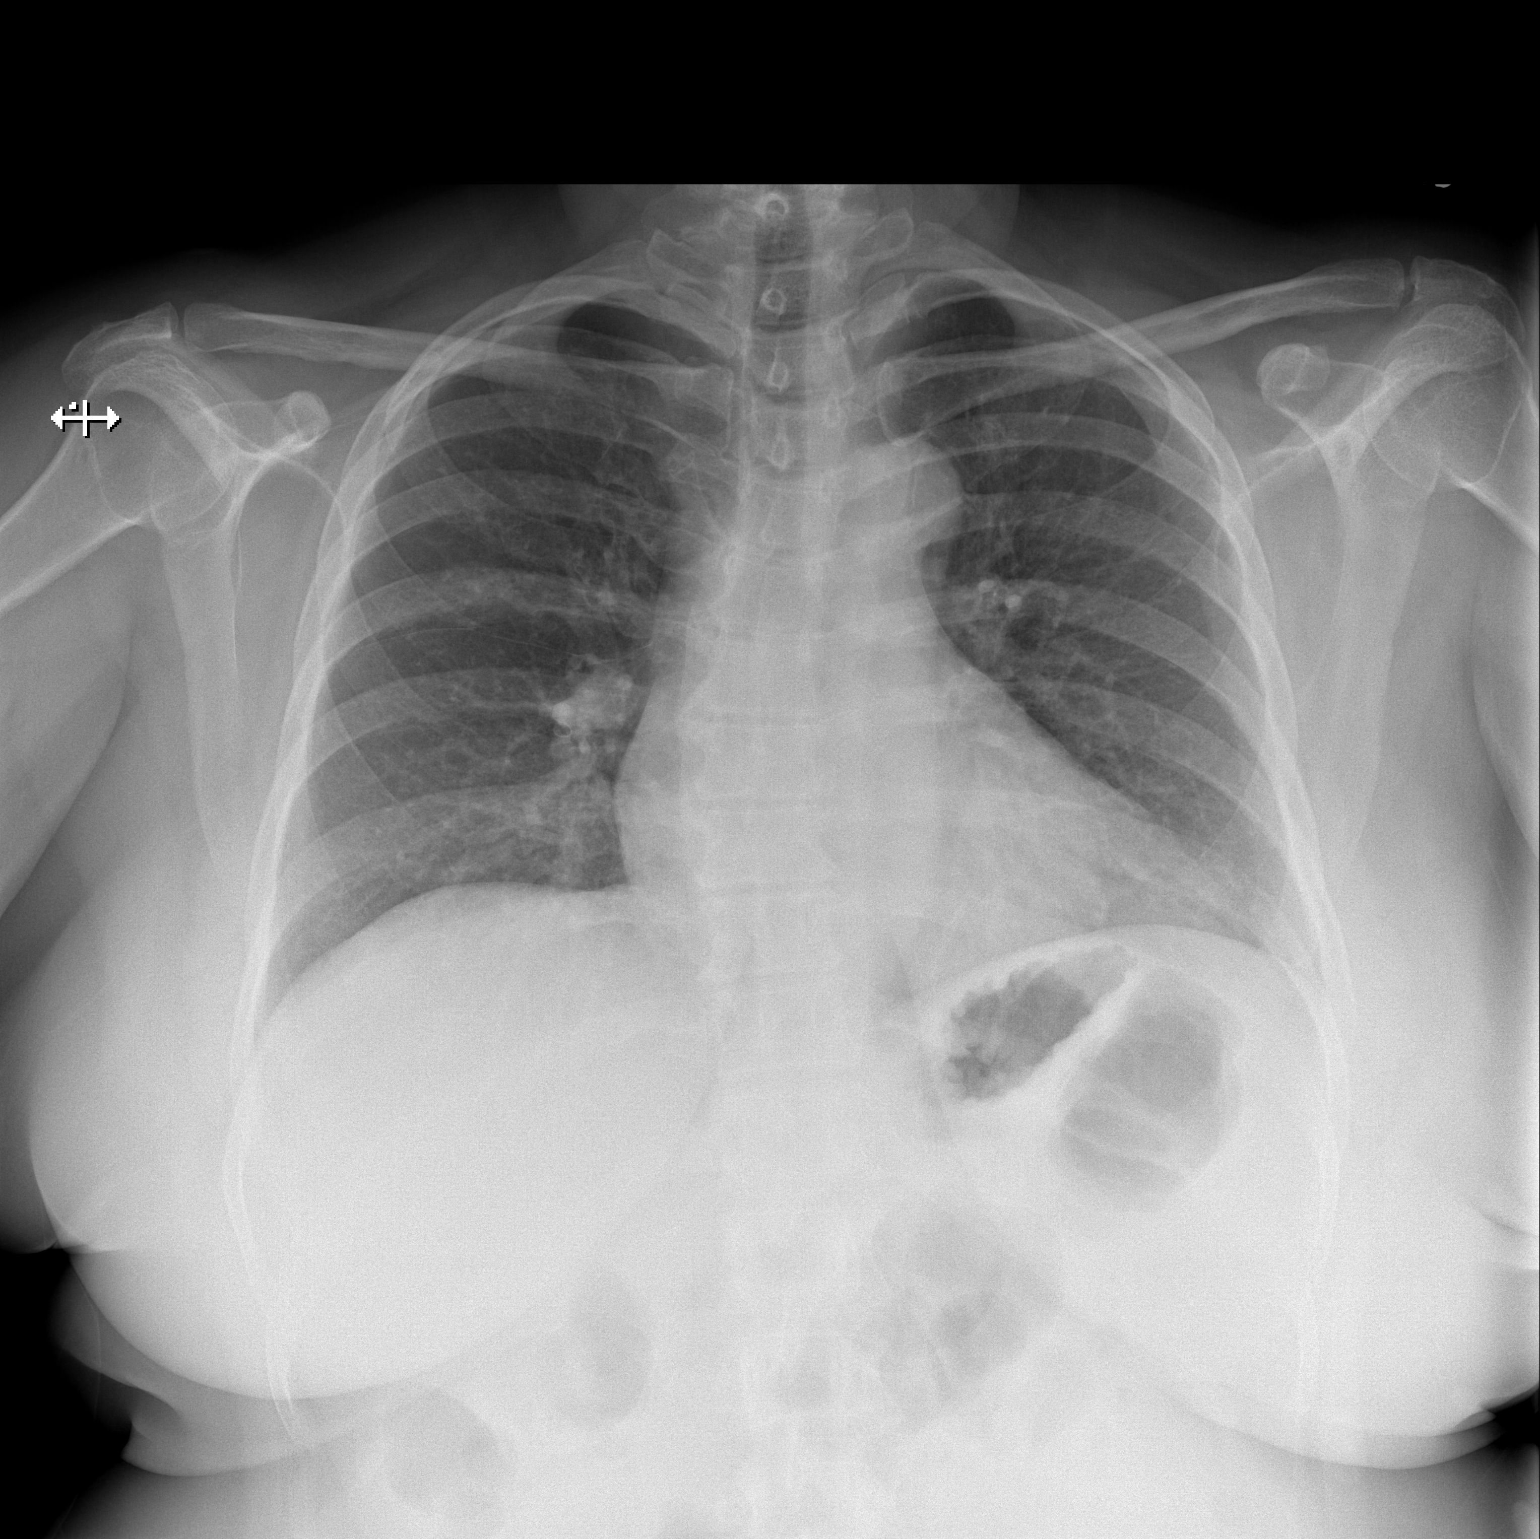

[w chest lat]
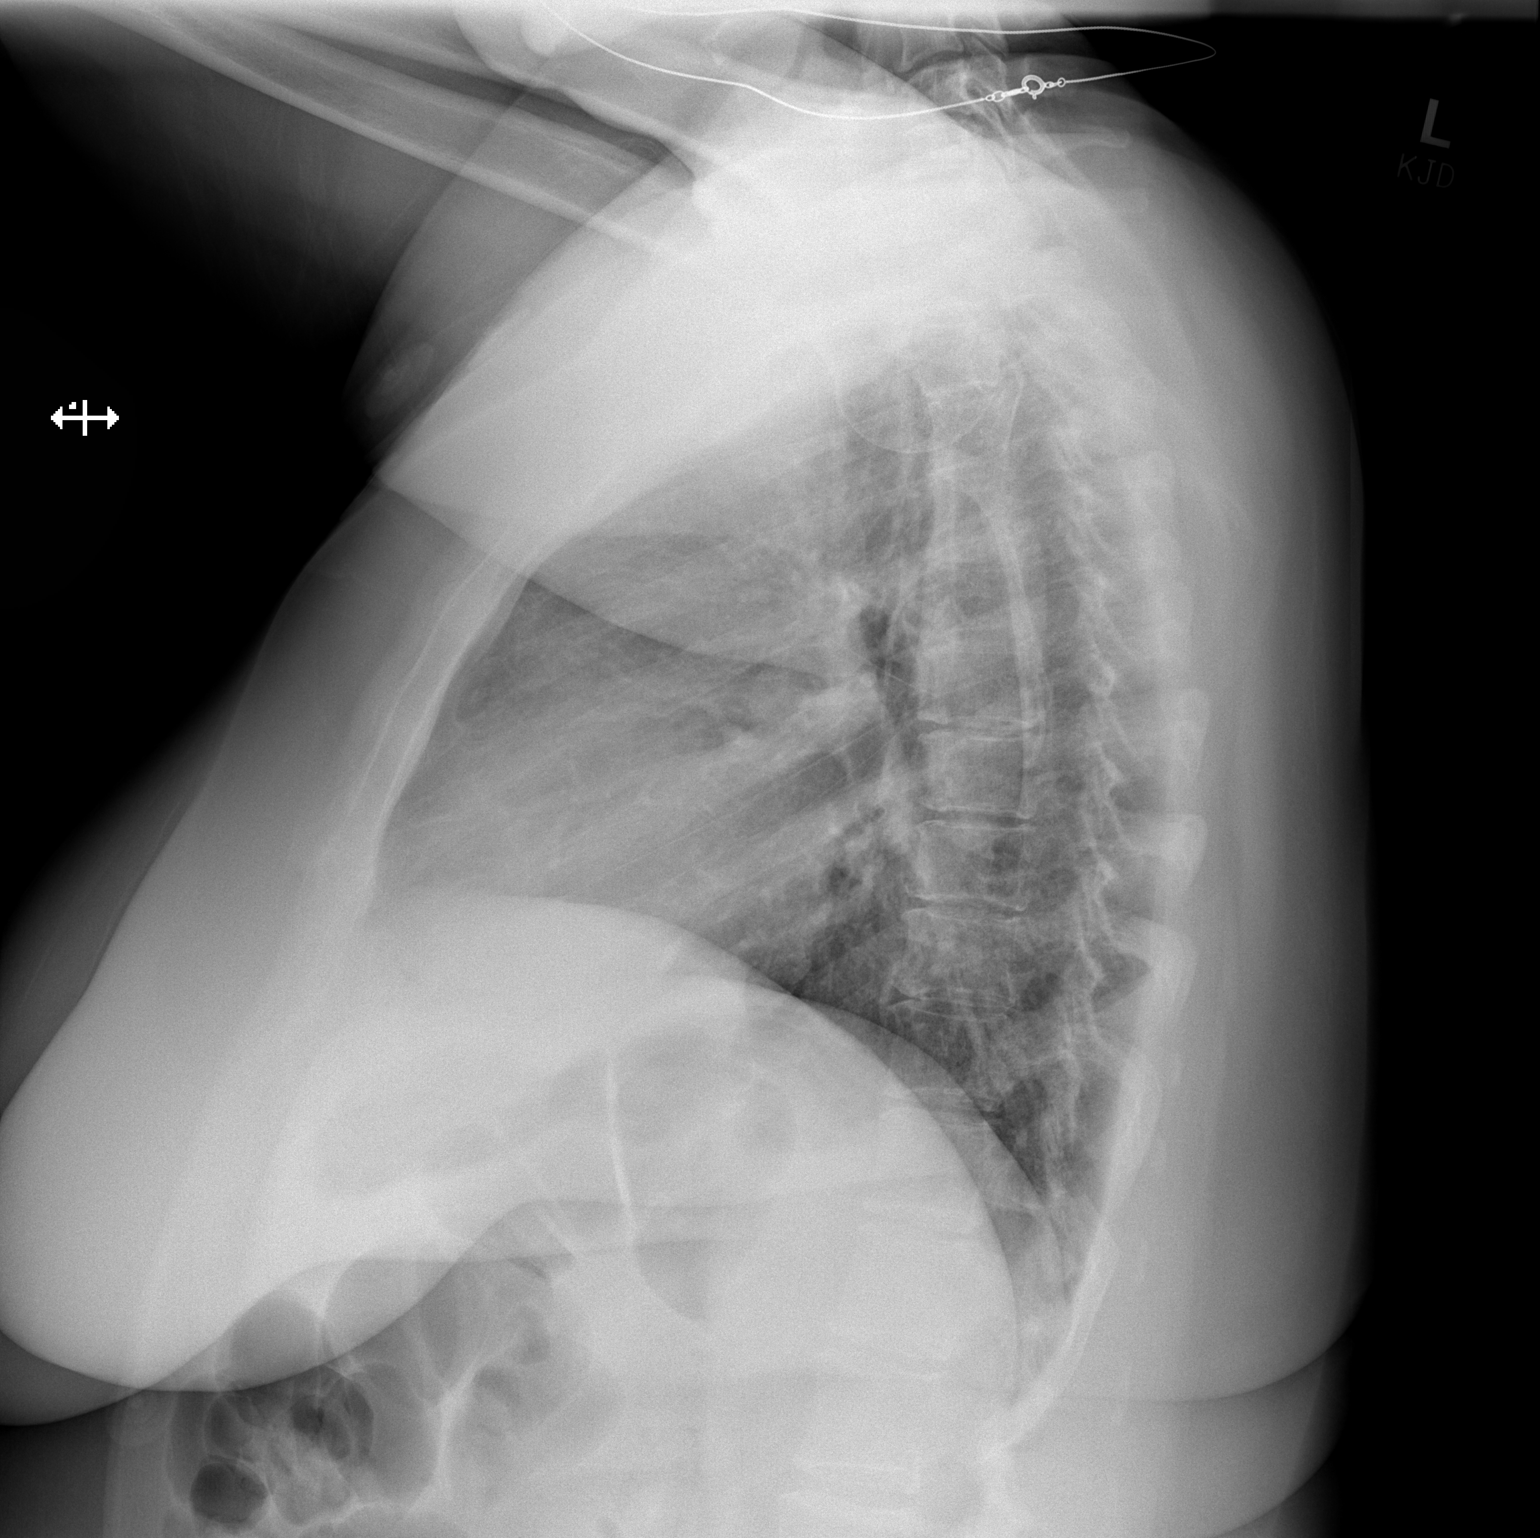

[2 of 2 positions shown; findings below may reference images not displayed]

FINDINGS: Somewhat low lung volumes. Normal cardiac size and mediastinal
contours. Visualized tracheal air column is within normal limits.
Both lungs appear clear. No pneumothorax, pleural effusion or
evidence of pneumoperitoneum. Visible bowel gas pattern is within
normal limits. Mild dextroconvex thoracic scoliosis. No acute
osseous abnormality identified.
IMPRESSION: Low lung volumes.  No acute cardiopulmonary abnormality.

## 2022-02-13 LAB — FECAL OCCULT BLOOD, IMMUNOCHEMICAL: Fecal Occult Bld: NEGATIVE

## 2022-02-14 ENCOUNTER — Telehealth: Payer: Self-pay

## 2022-02-14 NOTE — Telephone Encounter (Signed)
-----   Message from Storm Frisk, MD sent at 02/13/2022  6:13 PM EDT ----- Regarding: FW:  Let pt know fecal occult negative. No colon cancer. Rechk one year ----- Message ----- From: Interface, [facility] Lab Results In Sent: 01/15/2022   7:38 AM EDT To: Storm Frisk, MD

## 2022-02-14 NOTE — Telephone Encounter (Signed)
Pt was called and is aware of results, DOB was confirmed.  ?

## 2022-02-17 ENCOUNTER — Telehealth: Payer: Self-pay

## 2022-02-17 MED ORDER — TERBINAFINE HCL 250 MG PO TABS
250.0000 mg | ORAL_TABLET | Freq: Every day | ORAL | 0 refills | Status: DC
Start: 2022-02-17 — End: 2022-10-17

## 2022-02-17 NOTE — Addendum Note (Signed)
Addended by: Storm Frisk on: 02/17/2022 04:39 PM   Modules accepted: Orders

## 2022-02-17 NOTE — Telephone Encounter (Signed)
Pt is wanting toe fungus medication

## 2022-02-18 NOTE — Telephone Encounter (Signed)
Called patient and she is aware

## 2022-04-06 NOTE — Progress Notes (Signed)
New Patient Office Visit  Subjective    Patient ID: Amy Frey, female    DOB: 09/23/1965  Age: 56 y.o. MRN: [id number]  CC:  No chief complaint on file.   HPI Amy Frey  is a well appearing 56 year female with a history of migraines and depression presents to establish care. Accompanied by her daughter. She states she has not seen a provider in over 5 years. Today she complains of intermittent pins and needles that occur in her hands and feet. She states the symptoms appear randomly and at times she will "shake it out" to make it go away. In the past she was told her vitamin levels were low. Denies any trauma to the areas.  She is also experiencing trouble at night sleeping. She states she has no problem falling asleep but will routinely wake up in the middle of the night causing her to be fatigued. Her family member states that she does have excessive snoring.   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 She states her jobs is very physical and requires a lot of heavy lifting. She is married and lives at home with her husband with 7 children. She has excellent social support. Admits to marijuana use, drinks socially.  Patient is requesting blood work and cancer screenings due to family history.  9/11  Other   Paresthesia - Primary    Patient presenting with bilateral upper and lower extremity paraesthesia. Hemoglobin A1C, Complete blood count, and Vitamin levels ordered to rule out anemia, vitamin deficiency, and diabetic neuropathy. Will consider other pathology and referral to Neurology for condition if lab work unremarkable.      Fatigue    Patient having trouble staying asleep at night. Excessively snoring with fatigue during the day. Thyroid panel ordered to rule out endocrinology pathology. Will consider at home sleep study at follow up. May have sleep apnea would benefit from home sleep study will need to get orange card first      BMI 37.0-37.9,  adult    Counseled patient on necessary changes to dietary and activity levels. This included eating more vegetables, fruits, lean meats, limiting salt intake and exercising 30 minutes daily 5x a week.       Outpatient Encounter Medications as of 04/07/2022  Medication Sig  . ibuprofen (ADVIL) 200 MG tablet Take 200 mg by mouth every 6 (six) hours as needed.  . Multiple Vitamin (MULTIVITAMIN WITH MINERALS) TABS tablet Take 1 tablet by mouth daily.  Marland Kitchen terbinafine (LAMISIL) 250 MG tablet Take 1 tablet (250 mg total) by mouth daily.   No facility-administered encounter medications on file as of 04/07/2022.    Past Medical History:  Diagnosis Date  . Arthritis   . Depression   . Migraine   . Vertigo     Past Surgical History:  Procedure Laterality Date  . APPENDECTOMY      Family History  Problem Relation Age of Onset  . Healthy Mother   . Heart failure Father     Social History   Socioeconomic History  . Marital status: Married    Spouse name: Not on file  . Number of children: Not on file  . Years of education: Not on file  . Highest education level: Not on file  Occupational History  . Not on file  Tobacco Use  . Smoking status: Never  . Smokeless tobacco: Never  Vaping Use  . Vaping Use: Former  Substance and Sexual Activity  .  Alcohol use: Yes    Alcohol/week: 1.0 - 2.0 standard drink of alcohol    Types: 1 - 2 Glasses of wine per week    Comment: somtimes on stressful days or partys  . Drug use: Yes    Types: Marijuana  . Sexual activity: Yes  Other Topics Concern  . Not on file  Social History Narrative  . Not on file   Social Determinants of Health   Financial Resource Strain: Not on file  Food Insecurity: Not on file  Transportation Needs: Not on file  Physical Activity: Not on file  Stress: Not on file  Social Connections: Not on file  Intimate Partner Violence: Not on file    Review of Systems  Constitutional: Negative.   HENT: Negative.     Eyes: Negative.   Respiratory: Negative.    Cardiovascular: Negative.   Gastrointestinal: Negative.   Genitourinary: Negative.   Musculoskeletal: Negative.   Skin: Negative.   Neurological: Negative.   Endo/Heme/Allergies: Negative.   Psychiatric/Behavioral:  Positive for depression.         Objective    There were no vitals taken for this visit.  Physical Exam Constitutional:      Appearance: Normal appearance. She is obese.  HENT:     Right Ear: Tympanic membrane and ear canal normal.     Left Ear: Tympanic membrane and ear canal normal.     Nose: Nose normal.     Mouth/Throat:     Mouth: Mucous membranes are moist.     Pharynx: Oropharynx is clear.  Cardiovascular:     Rate and Rhythm: Normal rate and regular rhythm.     Pulses: Normal pulses.     Heart sounds: Normal heart sounds.  Pulmonary:     Effort: Pulmonary effort is normal.     Breath sounds: Normal breath sounds.  Abdominal:     General: Abdomen is flat. Bowel sounds are normal.     Palpations: Abdomen is soft.  Skin:    General: Skin is warm and dry.  Neurological:     Mental Status: She is alert.  Psychiatric:        Mood and Affect: Mood normal.        Behavior: Behavior normal.        Assessment & Plan:   Problem List Items Addressed This Visit   None Encourage patient to receive application for orange card [place] discount, if she receives this we can consider an evaluation for sleep apnea We will proceed with HIV hepatitis C screening we will order mammogram by way of the scholarship program and will issue a fecal occult kit for colon cancer screening Patient be brought back for a Pap smear with another partner  No follow-ups on file.   Asencion Noble, MD

## 2022-04-07 ENCOUNTER — Ambulatory Visit: Payer: Self-pay | Attending: Critical Care Medicine | Admitting: Critical Care Medicine

## 2022-04-07 ENCOUNTER — Encounter: Payer: Self-pay | Admitting: Critical Care Medicine

## 2022-04-07 ENCOUNTER — Other Ambulatory Visit: Payer: Self-pay

## 2022-04-07 VITALS — BP 116/85 | HR 71 | Ht 59.0 in | Wt 198.8 lb

## 2022-04-07 DIAGNOSIS — Z139 Encounter for screening, unspecified: Secondary | ICD-10-CM

## 2022-04-07 DIAGNOSIS — B351 Tinea unguium: Secondary | ICD-10-CM | POA: Insufficient documentation

## 2022-04-07 DIAGNOSIS — M545 Low back pain, unspecified: Secondary | ICD-10-CM

## 2022-04-07 DIAGNOSIS — Z6837 Body mass index (BMI) 37.0-37.9, adult: Secondary | ICD-10-CM

## 2022-04-07 DIAGNOSIS — R5383 Other fatigue: Secondary | ICD-10-CM

## 2022-04-07 DIAGNOSIS — E669 Obesity, unspecified: Secondary | ICD-10-CM

## 2022-04-07 DIAGNOSIS — R202 Paresthesia of skin: Secondary | ICD-10-CM

## 2022-04-07 DIAGNOSIS — G8929 Other chronic pain: Secondary | ICD-10-CM | POA: Insufficient documentation

## 2022-04-07 MED ORDER — GABAPENTIN 100 MG PO CAPS: 100.0000 mg | ORAL_CAPSULE | Freq: Three times a day (TID) | ORAL | 3 refills | Status: DC

## 2022-04-07 MED ORDER — GABAPENTIN 100 MG PO CAPS
100.0000 mg | ORAL_CAPSULE | Freq: Three times a day (TID) | ORAL | 3 refills | Status: DC
  Filled 2022-04-07: qty 90, 30d supply, fill #0

## 2022-04-07 NOTE — Assessment & Plan Note (Signed)
Patient would benefit with a home sleep study needs to get the orange card first

## 2022-04-07 NOTE — Assessment & Plan Note (Signed)
Involving both great toenails she is on Lamisil now for 3 months we will assess liver function this visit

## 2022-04-07 NOTE — Assessment & Plan Note (Signed)
Low back pain due to muscle imbalance patient given back exercises and recommended to join a gym such as the [employer] and do water aerobics or yoga

## 2022-04-07 NOTE — Assessment & Plan Note (Signed)
The following Lifestyle Medicine recommendations according to American College of Lifestyle Medicine (ACLM) were discussed and offered to patient who agrees to start the journey:  A. Whole Foods, Plant-based plate comprising of fruits and vegetables, plant-based proteins, whole-grain carbohydrates was discussed in detail with the patient.   A list for source of those nutrients were also provided to the patient.  Patient will use only water or unsweetened tea for hydration. B.  The need to stay away from risky substances including alcohol, smoking; obtaining 7 to 9 hours of restorative sleep, at least 150 minutes of moderate intensity exercise weekly, the importance of healthy social connections,  and stress reduction techniques were discussed. 

## 2022-04-07 NOTE — Assessment & Plan Note (Signed)
Paresthesia involving upper and lower extremities she cannot go to neurology yet because she does not have the orange card she will work on this.  In the meantime she will try gabapentin 100 mg 3 times daily

## 2022-04-07 NOTE — Patient Instructions (Addendum)
Finish Lamisil for toenail fungus and today we will check a liver function test to monitor any effects on your liver, your liver was normal at the last visit  Get fish oil and take 1 capsule daily for elevated cholesterol and follow-up plant-based diet as we have discussed previously see below  Mammogram has been scheduled we will get you to fill out the scholarship applications that this will be free  Keep your October appointment for your Pap smear  Please pick up the application for [place] discount and orange card once you achieve this we can send you to podiatry and obtain a sleep study  Recommend starting vitamin B12 daily and will give you gabapentin 100 mg 3 times daily for back pain and arm numbness and was sent to our pharmacy downstairs  See back exercises as attached and do recommend you joint a fitness center like the [employer] we discussed the one on [place] in [place], [place] this is an excellent location  Return to see Dr. Delford Field in 5 months         Advice for Weight Management   -For most of [place] the best way to lose weight is by diet management. Generally speaking, diet management means consuming less calories intentionally which over time brings about progressive weight loss.  This can be achieved more effectively by avoiding ultra processed carbohydrates, processed meats, unhealthy fats.    It is critically important to know your numbers: how much calorie you are consuming and how much calorie you need. More importantly, our carbohydrates sources should be unprocessed naturally occurring  complex starch food items.  It is always important to balance nutrition also by  appropriate intake of proteins (mainly plant-based), healthy fats/oils, plenty of fruits and vegetables.    -The American College of Lifestyle Medicine (ACL M) recommends nutrition derived mostly from Whole Food, Plant Predominant Sources example an apple instead of applesauce or apple pie.  Eat Plenty of vegetables, Mushrooms, fruits, Legumes, Whole Grains, Nuts, seeds in lieu of processed meats, processed snacks/pastries red meat, poultry, eggs.  Use only water or unsweetened tea for hydration.  The College also recommends the need to stay away from risky substances including alcohol, smoking; obtaining 7-9 hours of restorative sleep, at least 150 minutes of moderate intensity exercise weekly, importance of healthy social connections, and being mindful of stress and seek help when it is overwhelming.     -Sticking to a routine mealtime to eat 3 meals a day and avoiding unnecessary snacks is shown to have a big role in weight control. Under normal circumstances, the only time we burn stored energy is when we are hungry, so allow  some hunger to take place- hunger means no food between appropriate meal times, only water.  It is not advisable to starve.    -It is better to avoid simple carbohydrates including: Cakes, Sweet Desserts, Ice Cream, Soda (diet and regular), Sweet Tea, Candies, Chips, Cookies, Store Bought Juices, Alcohol in Excess of  1-2 drinks a day, Lemonade,  Artificial Sweeteners, Doughnuts, Coffee Creamers, "Sugar-free" Products, etc, etc.  This is not a complete list...Marland Kitchen.    -Consulting with certified diabetes educators is proven to provide you with the most accurate and current information on diet.  Also, you may be  interested in discussing diet options/exchanges , we can schedule a visit with Norm Salt, RDN, CDE for individualized nutrition education.   -Exercise: If you are able: 30 -60 minutes a day ,  4 days a week, or 150 minutes of moderate intensity exercise weekly.    The longer the better if tolerated.  Combine stretch, strength, and aerobic activities.  If you were told in the past that you have high risk for cardiovascular diseases, or if you are currently symptomatic, you may seek evaluation by your heart doctor prior to initiating moderate to intense exercise  programs.                                    Additional Care Considerations for Diabetes/Prediabetes     -Diabetes  is a chronic disease.  The most important care consideration is regular follow-up with your diabetes care provider with the goal being avoiding or delaying its complications and to take advantage of advances in medications and technology.  If appropriate actions are taken early enough, type 2 diabetes can even be reversed.  Seek information from the right source.   - Whole Food, Plant Predominant Nutrition is highly recommended: Eat Plenty of vegetables, Mushrooms, fruits, Legumes, Whole Grains, Nuts, seeds in lieu of processed meats, processed snacks/pastries red meat, poultry, eggs as recommended by [employer] of  Lifestyle Medicine (ACLM).   -Type 2 diabetes is known to coexist with other important comorbidities such as high blood pressure and high cholesterol.  It is critical to control not only the diabetes but also the high blood pressure and high cholesterol to minimize and delay the risk of complications including coronary artery disease, stroke, amputations, blindness, etc.  The good news is that this diet recommendation for type 2 diabetes is also very helpful for managing high cholesterol and high blood blood pressure.   - Studies showed that people with diabetes will benefit from a class of medications known as ACE inhibitors and statins.  Unless there are specific reasons not to be on these medications, the standard of care is to consider getting one from these groups of medications at an optimal doses.  These medications are generally considered safe and proven to help protect the heart and the kidneys.     - People with diabetes are encouraged to initiate and maintain regular follow-up with eye doctors, foot doctors, dentists , and if necessary heart and kidney doctors.      - It is highly recommended that people with diabetes quit smoking or stay away from  smoking, and get yearly  flu vaccine and pneumonia vaccine at least every 5 years.  See above for additional recommendations on exercise, sleep, stress management , and healthy social connections.

## 2022-04-08 LAB — HEPATIC FUNCTION PANEL
ALT: 12 IU/L (ref 0–32)
AST: 15 IU/L (ref 0–40)
Albumin: 4.2 g/dL (ref 3.8–4.9)
Alkaline Phosphatase: 56 IU/L (ref 44–121)
Bilirubin Total: <0.2 mg/dL (ref 0.0–1.2)
Bilirubin, Direct: <0.1 mg/dL (ref 0.00–0.40)
Total Protein: 6.6 g/dL (ref 6.0–8.5)

## 2022-04-08 NOTE — Progress Notes (Signed)
Let pt know liver is normal continue medication for foenail fungus to completion

## 2022-04-11 ENCOUNTER — Telehealth: Payer: Self-pay

## 2022-04-11 NOTE — Telephone Encounter (Signed)
Pt was called and is aware of results, DOB was confirmed.  ?

## 2022-04-11 NOTE — Telephone Encounter (Signed)
-----   Message from Storm Frisk, MD sent at 04/08/2022  6:00 AM EDT ----- Let pt know liver is normal continue medication for foenail fungus to completion

## 2022-04-14 ENCOUNTER — Other Ambulatory Visit: Payer: Self-pay

## 2022-04-28 ENCOUNTER — Telehealth: Payer: Self-pay

## 2022-04-28 NOTE — Telephone Encounter (Signed)
Telephoned patient at mobile number. Left a voice message with [facility] contact information. 

## 2022-05-20 ENCOUNTER — Ambulatory Visit: Payer: Medicaid Other | Admitting: Critical Care Medicine

## 2022-05-21 ENCOUNTER — Ambulatory Visit: Payer: Medicaid Other | Admitting: Physician Assistant

## 2022-09-09 ENCOUNTER — Ambulatory Visit: Payer: Medicaid Other | Admitting: Critical Care Medicine

## 2022-10-17 ENCOUNTER — Encounter: Payer: Self-pay | Admitting: Nurse Practitioner

## 2022-10-17 ENCOUNTER — Ambulatory Visit: Payer: Self-pay | Attending: Critical Care Medicine | Admitting: Nurse Practitioner

## 2022-10-17 VITALS — BP 102/70 | HR 70 | Ht 59.0 in | Wt 194.0 lb

## 2022-10-17 DIAGNOSIS — M79641 Pain in right hand: Secondary | ICD-10-CM | POA: Insufficient documentation

## 2022-10-17 DIAGNOSIS — F5101 Primary insomnia: Secondary | ICD-10-CM | POA: Insufficient documentation

## 2022-10-17 DIAGNOSIS — M79644 Pain in right finger(s): Secondary | ICD-10-CM | POA: Insufficient documentation

## 2022-10-17 DIAGNOSIS — M79642 Pain in left hand: Secondary | ICD-10-CM | POA: Insufficient documentation

## 2022-10-17 DIAGNOSIS — M1990 Unspecified osteoarthritis, unspecified site: Secondary | ICD-10-CM | POA: Insufficient documentation

## 2022-10-17 DIAGNOSIS — Z79899 Other long term (current) drug therapy: Secondary | ICD-10-CM | POA: Insufficient documentation

## 2022-10-17 DIAGNOSIS — M25512 Pain in left shoulder: Secondary | ICD-10-CM | POA: Insufficient documentation

## 2022-10-17 DIAGNOSIS — G5603 Carpal tunnel syndrome, bilateral upper limbs: Secondary | ICD-10-CM | POA: Insufficient documentation

## 2022-10-17 DIAGNOSIS — Z130 Encounter for screening for diseases of the blood and blood-forming organs and certain disorders involving the immune mechanism: Secondary | ICD-10-CM | POA: Insufficient documentation

## 2022-10-17 MED ORDER — NAPROXEN 500 MG PO TABS: 500.0000 mg | ORAL_TABLET | Freq: Two times a day (BID) | ORAL | 6 refills | Status: DC

## 2022-10-17 NOTE — Progress Notes (Signed)
Possible arthritis in hands.

## 2022-10-17 NOTE — Progress Notes (Signed)
Assessment & Plan:  Amy Frey was seen today for hand pain.  Diagnoses and all orders for this visit:  Carpal tunnel syndrome, bilateral -     naproxen (NAPROSYN) 500 MG tablet; Take 1 tablet (500 mg total) by mouth 2 (two) times daily with a meal. Continue to wear hand splint as needed If no improvement with splint or naproxen will need referral to hand specai  Primary insomnia May try melatonin  Acute pain of left shoulder May try tylenol or naproxen    Patient has been counseled on age-appropriate routine health concerns for screening and prevention. These are reviewed and up-to-date. Referrals have been placed accordingly. Immunizations are up-to-date or declined.    Subjective:   Chief Complaint  Patient presents with   Hand Pain    Amy Frey 57 y.o. female presents to office today for bilateral hand pain.  She has a past medical history of Arthritis, Depression, Migraine, and Vertigo.    Carpal Tunnel Syndrome: Patient presents for presents evaluation of pain in hands and possible carpal tunnel syndrome.  Onset of the symptoms was several months ago. Current symptoms include  pain in the right thumb and between the right thumb and right index finger. There is also pain in the left hand . 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 Also works during daytime cleaning, sweeping, cleaning, mopping. 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  Patient's course of [id number] and symptoms have progressed to a point and plateaued. Evaluation to date: none.  Pain is described as aching, sharp, and throbbing, and is intermittent .  Associated symptoms include: decreased range of motion.  Related to injury:   no.   She also endorses left shoulder pain which is intermittent and described as clicking sensation. No radiating pain, tingling or numbness. Unrelated to any injury but she states could be related to her job which  involves heavy lifting.   Finds it difficult to get a full night's sleep. No matter what time she goes to sleep she still wakes up around 6am.   Review of Systems  Constitutional:  Negative for fever, malaise/fatigue and weight loss.  HENT: Negative.  Negative for nosebleeds.   Eyes: Negative.  Negative for blurred vision, double vision and photophobia.  Respiratory: Negative.  Negative for cough and shortness of breath.   Cardiovascular: Negative.  Negative for chest pain, palpitations and leg swelling.  Gastrointestinal: Negative.  Negative for heartburn, nausea and vomiting.  Musculoskeletal:  Positive for joint pain. Negative for myalgias.  Neurological: Negative.  Negative for dizziness, focal weakness, seizures and headaches.  Psychiatric/Behavioral:  Negative for suicidal ideas. The patient has insomnia.     Past Medical History:  Diagnosis Date   Arthritis    Depression    Migraine    Vertigo     Past Surgical History:  Procedure Laterality Date   APPENDECTOMY      Family History  Problem Relation Age of Onset   Healthy Mother    Heart failure Father     Social History Reviewed with no changes to be made today.   Outpatient Medications Prior to Visit  Medication Sig Dispense Refill   Multiple Vitamin (MULTIVITAMIN WITH MINERALS) TABS tablet Take 1 tablet by mouth daily.     ibuprofen (ADVIL) 200 MG tablet Take 200 mg by mouth every 6 (six) hours as needed.     gabapentin (NEURONTIN) 100 MG capsule Take 1 capsule (100 mg total) by mouth  3 (three) times daily. (Patient not taking: Reported on 10/17/2022) 90 capsule 3   terbinafine (LAMISIL) 250 MG tablet Take 1 tablet (250 mg total) by mouth daily. (Patient not taking: Reported on 10/17/2022) 90 tablet 0   No facility-administered medications prior to visit.    No Known Allergies     Objective:    BP 102/70   Pulse 70   Ht 4\' 11"  (1.499 m)   Wt 194 lb (88 kg)   SpO2 97%   BMI 39.18 kg/m  Wt Readings from  Last 3 Encounters:  10/17/22 194 lb (88 kg)  04/07/22 198 lb 12.8 oz (90.2 kg)  01/14/22 190 lb 6.4 oz (86.4 kg)    Physical Exam Vitals and nursing note reviewed.  Constitutional:      Appearance: She is well-developed.  HENT:     Head: Normocephalic and atraumatic.  Cardiovascular:     Rate and Rhythm: Normal rate and regular rhythm.     Heart sounds: Normal heart sounds. No murmur heard.    No friction rub. No gallop.  Pulmonary:     Effort: Pulmonary effort is normal. No tachypnea or respiratory distress.     Breath sounds: Normal breath sounds. No decreased breath sounds, wheezing, rhonchi or rales.  Chest:     Chest wall: No tenderness.  Abdominal:     General: Bowel sounds are normal.     Palpations: Abdomen is soft.  Musculoskeletal:        General: Normal range of motion.     Right shoulder: No swelling. Normal range of motion.     Left shoulder: No swelling. Normal range of motion.     Right hand: No swelling or deformity. Normal range of motion.     Left hand: No swelling or deformity. Normal range of motion.     Cervical back: Normal range of motion.  Skin:    General: Skin is warm and dry.  Neurological:     Mental Status: She is alert and oriented to person, place, and time.     Coordination: Coordination normal.  Psychiatric:        Behavior: Behavior normal. Behavior is cooperative.        Thought Content: Thought content normal.        Judgment: Judgment normal.          Patient has been counseled extensively about nutrition and exercise as well as the importance of adherence with medications and regular follow-up. The patient was given clear instructions to go to ER or return to medical center if symptoms don't improve, worsen or new problems develop. The patient verbalized understanding.   Follow-up: Return if symptoms worsen or fail to improve.   Gildardo Pounds, FNP-BC [facility] and [facility] [place],  [place]   10/17/2022, 3:05 PM

## 2022-10-18 LAB — CMP14+EGFR
ALT: 13 IU/L (ref 0–32)
AST: 15 IU/L (ref 0–40)
Albumin/Globulin Ratio: 1.6 (ref 1.2–2.2)
Albumin: 4.2 g/dL (ref 3.8–4.9)
Alkaline Phosphatase: 57 IU/L (ref 44–121)
BUN/Creatinine Ratio: 32 — ABNORMAL HIGH (ref 9–23)
BUN: 20 mg/dL (ref 6–24)
Bilirubin Total: <0.2 mg/dL (ref 0.0–1.2)
CO2: 25 mmol/L (ref 20–29)
Calcium: 9.3 mg/dL (ref 8.7–10.2)
Chloride: 105 mmol/L (ref 96–106)
Creatinine, Ser: 0.63 mg/dL (ref 0.57–1.00)
Globulin, Total: 2.6 g/dL (ref 1.5–4.5)
Glucose: 93 mg/dL (ref 70–99)
Potassium: 4.1 mmol/L (ref 3.5–5.2)
Sodium: 141 mmol/L (ref 134–144)
Total Protein: 6.8 g/dL (ref 6.0–8.5)
eGFR: 102 mL/min/{1.73_m2} (ref >59)

## 2022-10-18 LAB — CBC WITH DIFFERENTIAL/PLATELET
Basophils Absolute: 0 10*3/uL (ref 0.0–0.2)
Basos: 1 %
EOS (ABSOLUTE): 0.2 10*3/uL (ref 0.0–0.4)
Eos: 4 %
Hematocrit: 38.8 % (ref 34.0–46.6)
Hemoglobin: 12.5 g/dL (ref 11.1–15.9)
Immature Grans (Abs): 0 10*3/uL (ref 0.0–0.1)
Immature Granulocytes: 0 %
Lymphocytes Absolute: 1.9 10*3/uL (ref 0.7–3.1)
Lymphs: 34 %
MCH: 29.3 pg (ref 26.6–33.0)
MCHC: 32.2 g/dL (ref 31.5–35.7)
MCV: 91 fL (ref 79–97)
Monocytes Absolute: 0.5 10*3/uL (ref 0.1–0.9)
Monocytes: 9 %
Neutrophils Absolute: 2.9 10*3/uL (ref 1.4–7.0)
Neutrophils: 52 %
Platelets: 193 10*3/uL (ref 150–450)
RBC: 4.26 x10E6/uL (ref 3.77–5.28)
RDW: 13.3 % (ref 11.7–15.4)
WBC: 5.5 10*3/uL (ref 3.4–10.8)

## 2022-11-20 ENCOUNTER — Emergency Department (HOSPITAL_COMMUNITY)
Admission: EM | Admit: 2022-11-20 | Discharge: 2022-11-20 | Disposition: A | Discharge status: Home / Self Care | Payer: Self-pay | Attending: Emergency Medicine | Admitting: Emergency Medicine

## 2022-11-20 ENCOUNTER — Encounter (HOSPITAL_COMMUNITY): Payer: Self-pay

## 2022-11-20 ENCOUNTER — Other Ambulatory Visit: Payer: Self-pay

## 2022-11-20 ENCOUNTER — Emergency Department (HOSPITAL_COMMUNITY): Payer: Self-pay

## 2022-11-20 DIAGNOSIS — N200 Calculus of kidney: Secondary | ICD-10-CM | POA: Insufficient documentation

## 2022-11-20 LAB — I-STAT CHEM 8, ED
BUN: 21 mg/dL — ABNORMAL HIGH (ref 6–20)
Calcium, Ion: 1.01 mmol/L — ABNORMAL LOW (ref 1.15–1.40)
Chloride: 107 mmol/L (ref 98–111)
Creatinine, Ser: 0.6 mg/dL (ref 0.44–1.00)
Glucose, Bld: 127 mg/dL — ABNORMAL HIGH (ref 70–99)
HCT: 37 % (ref 36.0–46.0)
Hemoglobin: 12.6 g/dL (ref 12.0–15.0)
Potassium: 3.9 mmol/L (ref 3.5–5.1)
Sodium: 138 mmol/L (ref 135–145)
TCO2: 25 mmol/L (ref 22–32)

## 2022-11-20 LAB — URINALYSIS, ROUTINE W REFLEX MICROSCOPIC
Bacteria, UA: NONE SEEN
Bilirubin Urine: NEGATIVE
Glucose, UA: NEGATIVE mg/dL
Ketones, ur: NEGATIVE mg/dL
Nitrite: NEGATIVE
Protein, ur: NEGATIVE mg/dL
Specific Gravity, Urine: 1.017 (ref 1.005–1.030)
pH: 7 (ref 5.0–8.0)

## 2022-11-20 LAB — COMPREHENSIVE METABOLIC PANEL
ALT: 17 U/L (ref 0–44)
AST: 18 U/L (ref 15–41)
Albumin: 3.6 g/dL (ref 3.5–5.0)
Alkaline Phosphatase: 44 U/L (ref 38–126)
Anion gap: 10 (ref 5–15)
BUN: 17 mg/dL (ref 6–20)
CO2: 23 mmol/L (ref 22–32)
Calcium: 9.1 mg/dL (ref 8.9–10.3)
Chloride: 105 mmol/L (ref 98–111)
Creatinine, Ser: 0.69 mg/dL (ref 0.44–1.00)
GFR, Estimated: >60 mL/min (ref >60)
Glucose, Bld: 123 mg/dL — ABNORMAL HIGH (ref 70–99)
Potassium: 4 mmol/L (ref 3.5–5.1)
Sodium: 138 mmol/L (ref 135–145)
Total Bilirubin: 0.5 mg/dL (ref 0.3–1.2)
Total Protein: 6.6 g/dL (ref 6.5–8.1)

## 2022-11-20 LAB — CBC WITH DIFFERENTIAL/PLATELET
Abs Immature Granulocytes: 0.04 10*3/uL (ref 0.00–0.07)
Basophils Absolute: 0 10*3/uL (ref 0.0–0.1)
Basophils Relative: 0 %
Eosinophils Absolute: 0.1 10*3/uL (ref 0.0–0.5)
Eosinophils Relative: 2 %
HCT: 38.4 % (ref 36.0–46.0)
Hemoglobin: 12.3 g/dL (ref 12.0–15.0)
Immature Granulocytes: 1 %
Lymphocytes Relative: 20 %
Lymphs Abs: 1.6 10*3/uL (ref 0.7–4.0)
MCH: 29.8 pg (ref 26.0–34.0)
MCHC: 32 g/dL (ref 30.0–36.0)
MCV: 93 fL (ref 80.0–100.0)
Monocytes Absolute: 0.5 10*3/uL (ref 0.1–1.0)
Monocytes Relative: 6 %
Neutro Abs: 5.7 10*3/uL (ref 1.7–7.7)
Neutrophils Relative %: 71 %
Platelets: 186 10*3/uL (ref 150–400)
RBC: 4.13 MIL/uL (ref 3.87–5.11)
RDW: 14.3 % (ref 11.5–15.5)
WBC: 7.9 10*3/uL (ref 4.0–10.5)
nRBC: 0 % (ref 0.0–0.2)

## 2022-11-20 LAB — PREGNANCY, URINE: Preg Test, Ur: NEGATIVE

## 2022-11-20 MED ORDER — HYDROCODONE-ACETAMINOPHEN 5-325 MG PO TABS: 2.0000 | ORAL_TABLET | ORAL | 0 refills | Status: DC | PRN

## 2022-11-20 MED ORDER — ONDANSETRON 4 MG PO TBDP: ORAL_TABLET | ORAL | 0 refills | Status: DC

## 2022-11-20 MED ORDER — LACTATED RINGERS IV BOLUS
1000.0000 mL | Freq: Once | INTRAVENOUS | Status: AC
  Administered 2022-11-20: 1000 mL via INTRAVENOUS

## 2022-11-20 MED ORDER — DIPHENHYDRAMINE HCL 50 MG/ML IJ SOLN
25.0000 mg | Freq: Once | INTRAMUSCULAR | Status: AC
  Administered 2022-11-20: 25 mg via INTRAVENOUS
  Filled 2022-11-20: qty 1

## 2022-11-20 MED ORDER — TAMSULOSIN HCL 0.4 MG PO CAPS: 0.4000 mg | ORAL_CAPSULE | Freq: Every day | ORAL | 0 refills | Status: DC

## 2022-11-20 MED ORDER — ACETAMINOPHEN 500 MG PO TABS
1000.0000 mg | ORAL_TABLET | Freq: Once | ORAL | Status: AC
  Administered 2022-11-20: 1000 mg via ORAL
  Filled 2022-11-20: qty 2

## 2022-11-20 MED ORDER — ONDANSETRON HCL 4 MG/2ML IJ SOLN
4.0000 mg | Freq: Once | INTRAMUSCULAR | Status: AC
  Administered 2022-11-20: 4 mg via INTRAVENOUS
  Filled 2022-11-20: qty 2

## 2022-11-20 MED ORDER — METOCLOPRAMIDE HCL 5 MG/ML IJ SOLN
10.0000 mg | Freq: Once | INTRAMUSCULAR | Status: AC
  Administered 2022-11-20: 10 mg via INTRAVENOUS
  Filled 2022-11-20: qty 2

## 2022-11-20 MED ORDER — IOHEXOL 350 MG/ML SOLN
75.0000 mL | Freq: Once | INTRAVENOUS | Status: AC | PRN
  Administered 2022-11-20: 75 mL via INTRAVENOUS

## 2022-11-20 MED ORDER — HYDROMORPHONE HCL 1 MG/ML IJ SOLN
1.0000 mg | Freq: Once | INTRAMUSCULAR | Status: AC
  Administered 2022-11-20: 1 mg via INTRAVENOUS
  Filled 2022-11-20: qty 1

## 2022-11-20 MED ORDER — METHOCARBAMOL 500 MG PO TABS
500.0000 mg | ORAL_TABLET | Freq: Once | ORAL | Status: AC
  Administered 2022-11-20: 500 mg via ORAL
  Filled 2022-11-20: qty 1

## 2022-11-20 MED ORDER — SODIUM CHLORIDE 0.9 % IV BOLUS: 1000.0000 mL | Freq: Once | INTRAVENOUS | Status: DC

## 2022-11-20 MED ORDER — MORPHINE SULFATE (PF) 4 MG/ML IV SOLN
4.0000 mg | Freq: Once | INTRAVENOUS | Status: AC
  Administered 2022-11-20: 4 mg via INTRAVENOUS
  Filled 2022-11-20: qty 1

## 2022-11-20 MED ORDER — KETOROLAC TROMETHAMINE 15 MG/ML IJ SOLN
15.0000 mg | Freq: Once | INTRAMUSCULAR | Status: AC
  Administered 2022-11-20: 15 mg via INTRAVENOUS
  Filled 2022-11-20: qty 1

## 2022-11-20 NOTE — ED Provider Notes (Signed)
[place] [facility] AT [facility] Provider Note  Medical Decision Making   HPI: Amy Frey is a 57 y.o. female with history perinent migraine, onychomycosis, obesity, chronic back pain who presents complaining of left-sided abdominal pain. Patient arrived via EMS work.  History provided by patient.  No interpreter required for this encounter.  Patient reports that she was at work when she experienced tenesmus, went to the bathroom, had 3 large formed bowel movements in rapid succession.  Reports that thereafter she felt lightheaded.  Did not lose consciousness.  Had 1 episode of emesis.  Continues to have left lower quadrant pain.  Reports that EMS was called, per nursing patient was administered 50 mcg of fentanyl en route with EMS.  Patient denies any dysuria, hematuria, history of nephrolithiasis, history of diverticulitis.  Denies ongoing nausea, reports current mild left lower quadrant pain, pain is nonradiating.  Denies any fevers, chills, chest pain, shortness of breath.  ROS: As per HPI. Please see MAR for complete past medical history, surgical history, and social history.   Physical exam is pertinent for left lower quadrant tenderness to palpation.   The differential includes but is not limited to diverticulitis, vasovagal syncope, constipation, enteritis, cystitis, pyelonephritis, nephrolithiasis, small bowel obstruction, pancreatitis.  Additional history obtained from: None External records from outside source obtained and reviewed including: None  ED provider interpretation of ECG: Rate 99, sinus rhythm, T wave inversion isolated in lead V1, no ST elevations or depressions.  ED provider interpretation of radiology/imaging: Personally reviewed CT of the abdomen pelvis, do not appreciate any intra-abdominal free air, significant intra-abdominal free fluid, fat stranding, hyperenhancement, air-fluid levels, I do appreciate mild left-sided  hydronephrosis  Labs ordered were interpreted by myself as well as my attending and were incorporated into the medical decision making process for this patient.  ED provider interpretation of labs: UA without UTI, CBC without leukocytosis, anemia, thrombocytopenia.  Urine pregnancy test negative.  MP without AKI or emergent electrolyte derangement.  Interventions: Lanelle, Robaxin, Zofran, morphine, Reglan, Benadryl, Toradol, Dilaudid  See the EMR for full details regarding lab and imaging results.  On initial evaluation, patient is well-appearing, mild tenderness to palpation in left lower quadrant.  Initial story is concerning for vagovagal syncope secondary to increased parasympathetic tone from multiple bowel movements.  Additionally abdomen only mildly tender to palpation and patient is well-appearing and comfortable on exam.  On reevaluation after labs were ordered patient appears uncomfortable, reports worsening pain, has increased tenderness to palpation, therefore do feel that CT abdomen pelvis with contrast is warranted to evaluate for further intra-abdominal pathology.  Ultimately labs are reassuring, the patient's CT of the abdomen pelvis does reveal 2 mm nephrolith at the left UVJ consistent with the anatomic distribution of patient's symptoms.  Patient did require multiple doses of pain control as well as antiemetics in the emergency department.  Discussed these findings with the patient at bedside, patient expresses understanding.  Patient only has mild hydronephrosis and hydroureter, no evidence of pyelonephritis or cystitis on UA, AKI, therefore feel that patient is stable for discharge with outpatient management.  Furl placed for urology follow-up, patient discharged with prescriptions for Norco, tamsulosin, Zofran.  Consults: Not indicated  Disposition: DISCHARGE: I believe that the patient is safe for discharge home with outpatient follow-up. Patient was informed of all pertinent  physical exam, laboratory, and imaging findings. Patient's suspected etiology of their symptom presentation was discussed with the patient and all questions were answered. We discussed following up  with PCP and urology. I provided thorough ED return precautions. The patient feels safe and comfortable with this plan.  The plan for this patient was discussed with Dr. Silverio Lay, who voiced agreement and who oversaw evaluation and treatment of this patient.  Clinical Impression:  1. Kidney stone    Discharge  Therapies: These medications and interventions were provided for the patient while in the ED. Medications  methocarbamol (ROBAXIN) tablet 500 mg (500 mg Oral Given 11/20/22 1802)  acetaminophen (TYLENOL) tablet 1,000 mg (1,000 mg Oral Given 11/20/22 1801)  lactated ringers bolus 1,000 mL (0 mLs Intravenous Stopped 11/20/22 2036)  morphine (PF) 4 MG/ML injection 4 mg (4 mg Intravenous Given 11/20/22 1924)  ondansetron (ZOFRAN) injection 4 mg (4 mg Intravenous Given 11/20/22 1924)  metoCLOPramide (REGLAN) injection 10 mg (10 mg Intravenous Given 11/20/22 2053)  diphenhydrAMINE (BENADRYL) injection 25 mg (25 mg Intravenous Given 11/20/22 2053)  HYDROmorphone (DILAUDID) injection 1 mg (1 mg Intravenous Given 11/20/22 2052)  iohexol (OMNIPAQUE) 350 MG/ML injection 75 mL (75 mLs Intravenous Contrast Given 11/20/22 2127)  ketorolac (TORADOL) 15 MG/ML injection 15 mg (15 mg Intravenous Given 11/20/22 2149)    MDM generated using voice dictation software and may contain dictation errors.  Please contact me for any clarification or with any questions.  Clinical Complexity A medically appropriate history, review of systems, and physical exam was performed.  Collateral history obtained from: None I personally reviewed the labs, EKG, imaging as discussed above. Patient's presentation is most consistent with acute complicated illness / injury requiring diagnostic workup Considered and ruled out life and body  threatening conditions  Treatment: Outpatient follow-up Medications: Parenteral controlled substances Discussed patient's care with providers from the following different specialties: none  Physical Exam   ED Triage Vitals  Enc Vitals Group     BP 11/20/22 1733 (!) 125/96     Pulse Rate 11/20/22 1733 64     Resp 11/20/22 1733 18     Temp 11/20/22 1733 98.1 F (36.7 C)     Temp Source 11/20/22 1733 Oral     SpO2 11/20/22 1733 100 %     Weight 11/20/22 1732 190 lb (86.2 kg)     Height 11/20/22 1732 4\' 11"  (1.499 m)     Head Circumference --      Peak Flow --      Pain Score 11/20/22 1732 5     Pain Loc --      Pain Edu? --      Excl. in GC? --      Physical Exam Vitals and nursing note reviewed.  Constitutional:      General: She is not in acute distress.    Appearance: She is well-developed. She is obese.  HENT:     Head: Normocephalic and atraumatic.  Eyes:     Extraocular Movements: Extraocular movements intact.     Conjunctiva/sclera: Conjunctivae normal.     Pupils: Pupils are equal, round, and reactive to light.  Cardiovascular:     Rate and Rhythm: Normal rate and regular rhythm.     Heart sounds: No murmur heard. Pulmonary:     Effort: Pulmonary effort is normal. No respiratory distress.     Breath sounds: Normal breath sounds.  Abdominal:     General: Bowel sounds are normal.     Palpations: Abdomen is soft.     Tenderness: There is abdominal tenderness (LLQ, mild). There is no right CVA tenderness, left CVA tenderness, guarding or rebound.  Musculoskeletal:  General: No swelling.     Cervical back: Neck supple.  Skin:    General: Skin is warm and dry.     Capillary Refill: Capillary refill takes less than 2 seconds.  Neurological:     General: No focal deficit present.     Mental Status: She is alert and oriented to person, place, and time.  Psychiatric:        Mood and Affect: Mood normal.       Procedure Note  Procedures  CT ABDOMEN  PELVIS W CONTRAST  Final Result      Julianne Rice, MD Emergency Medicine, PGY-2   Curley Spice, MD 11/21/22 0011    Charlynne Pander, MD 11/21/22 2026

## 2022-11-20 NOTE — ED Triage Notes (Signed)
"  Sudden acute LLQ abdominal pain, radiates to groin area and back, n/v x 1. Gave of fentanyl in route" per EMS

## 2022-11-20 NOTE — ED Provider Notes (Incomplete)
[place] EMERGENCY DEPARTMENT AT [facility] Provider Note  Medical Decision Making   HPI: Amy Frey is a 57 y.o. female with history perinent migraine, onychomycosis, obesity, chronic back pain who presents complaining of left-sided abdominal pain. Patient arrived via EMS work.  History provided by patient.  No interpreter required for this encounter.  Patient reports that she was at work when she experienced tenesmus, went to the bathroom, had 3 large formed bowel movements in rapid succession.  Reports that thereafter she felt lightheaded.  Did not lose consciousness.  Had 1 episode of emesis.  Continues to have left lower quadrant pain.  Reports that EMS was called, per nursing patient was administered 50 mcg of fentanyl en route with EMS.  Patient denies any dysuria, hematuria, history of nephrolithiasis, history of diverticulitis.  Denies ongoing nausea, reports current mild left lower quadrant pain, pain is nonradiating.  Denies any fevers, chills, chest pain, shortness of breath.  ROS: As per HPI. Please see MAR for complete past medical history, surgical history, and social history.   Physical exam is pertinent for left lower quadrant tenderness to palpation.   The differential includes but is not limited to diverticulitis, vasovagal syncope, constipation, enteritis, cystitis, pyelonephritis, nephrolithiasis, small bowel obstruction, pancreatitis.  Additional history obtained from: None External records from outside source obtained and reviewed including: None  ED provider interpretation of ECG: Rate 99, sinus rhythm, T wave inversion isolated in lead V1, no ST elevations or depressions.  ED provider interpretation of radiology/imaging: Personally reviewed CT of the abdomen pelvis, do not appreciate any intra-abdominal free air, significant intra-abdominal free fluid, fat stranding, hyperenhancement, air-fluid levels, I do appreciate mild left-sided  hydronephrosis  Labs ordered were interpreted by myself as well as my attending and were incorporated into the medical decision making process for this patient.  ED provider interpretation of labs: UA without UTI, CBC without leukocytosis, anemia, thrombocytopenia.  Urine pregnancy test negative.  MP without AKI or emergent electrolyte derangement.  Interventions: Lanelle, Robaxin, Zofran, morphine, Reglan, Benadryl, Toradol, Dilaudid  See the EMR for full details regarding lab and imaging results.  On initial evaluation, patient is well-appearing, mild tenderness to palpation in left lower quadrant.  Initial story is concerning for vagovagal syncope secondary to increased parasympathetic tone from multiple bowel movements.  Additionally abdomen only mildly tender to palpation and patient  Consults: ***  Disposition: {LSDISPO:29394}  The plan for this patient was discussed with Dr. ***, who voiced agreement and who oversaw evaluation and treatment of this patient.  Clinical Impression: No diagnosis found. Data Unavailable  Therapies: These medications and interventions were provided for the patient while in the ED. Medications - No data to display  MDM generated using voice dictation software and may contain dictation errors.  Please contact me for any clarification or with any questions.  Clinical Complexity A medically appropriate history, review of systems, and physical exam was performed.  Collateral history obtained from: *** I personally reviewed the labs, EKG, imaging as*** discussed above. Patient's presentation is most consistent with {LSEMCOPA:29396} ***Considered and ruled out life and body threatening conditions  Treatment: {LSTREATMENT:51051} Patient's {[phone]} increases the complexity of managing their presentation. Medications: {LSDRUGS:51050} Discussed patient's care with providers from the following different specialties: ***  Physical Exam   ED  Triage Vitals  Enc Vitals Group     BP      Pulse      Resp      Temp      Temp  src      SpO2      Weight      Height      Head Circumference      Peak Flow      Pain Score      Pain Loc      Pain Edu?      Excl. in GC?      Physical Exam    Procedure Note  Procedures  No orders to display    Julianne Rice, MD Emergency Medicine, PGY-2  {Document cardiac monitor, telemetry assessment procedure when appropriate:1}   {   Click here for ABCD2, HEART and other calculatorsREFRESH Note before signing :1}    {Document critical care time when appropriate:1} {Document review of labs and clinical decision tools ie heart score, Chads2Vasc2 etc:1}  {Document your independent review of radiology images, and any outside records:1} {Document your discussion with family members, caretakers, and with consultants:1} {Document social determinants of health affecting pt's care:1} {Document your decision making why or why not admission, treatments were needed:1}

## 2023-06-20 ENCOUNTER — Emergency Department (HOSPITAL_COMMUNITY)
Admission: EM | Admit: 2023-06-20 | Discharge: 2023-06-20 | Disposition: A | Discharge status: Home / Self Care | Payer: Commercial Managed Care - HMO | Attending: Emergency Medicine | Admitting: Emergency Medicine

## 2023-06-20 ENCOUNTER — Other Ambulatory Visit: Payer: Self-pay

## 2023-06-20 ENCOUNTER — Emergency Department (HOSPITAL_COMMUNITY): Payer: Commercial Managed Care - HMO

## 2023-06-20 ENCOUNTER — Encounter (HOSPITAL_COMMUNITY): Payer: Self-pay

## 2023-06-20 DIAGNOSIS — N390 Urinary tract infection, site not specified: Secondary | ICD-10-CM

## 2023-06-20 DIAGNOSIS — M5442 Lumbago with sciatica, left side: Secondary | ICD-10-CM | POA: Insufficient documentation

## 2023-06-20 DIAGNOSIS — M545 Low back pain, unspecified: Secondary | ICD-10-CM | POA: Diagnosis present

## 2023-06-20 DIAGNOSIS — N3000 Acute cystitis without hematuria: Secondary | ICD-10-CM | POA: Diagnosis not present

## 2023-06-20 DIAGNOSIS — G8929 Other chronic pain: Secondary | ICD-10-CM

## 2023-06-20 LAB — COMPREHENSIVE METABOLIC PANEL
ALT: 15 U/L (ref 0–44)
AST: 15 U/L (ref 15–41)
Albumin: 3.5 g/dL (ref 3.5–5.0)
Alkaline Phosphatase: 48 U/L (ref 38–126)
Anion gap: 8 (ref 5–15)
BUN: 11 mg/dL (ref 6–20)
CO2: 26 mmol/L (ref 22–32)
Calcium: 9 mg/dL (ref 8.9–10.3)
Chloride: 107 mmol/L (ref 98–111)
Creatinine, Ser: 0.75 mg/dL (ref 0.44–1.00)
GFR, Estimated: >60 mL/min (ref >60)
Glucose, Bld: 145 mg/dL — ABNORMAL HIGH (ref 70–99)
Potassium: 3.7 mmol/L (ref 3.5–5.1)
Sodium: 141 mmol/L (ref 135–145)
Total Bilirubin: 0.6 mg/dL (ref <1.2)
Total Protein: 7 g/dL (ref 6.5–8.1)

## 2023-06-20 LAB — URINALYSIS, ROUTINE W REFLEX MICROSCOPIC
Bilirubin Urine: NEGATIVE
Glucose, UA: NEGATIVE mg/dL
Ketones, ur: NEGATIVE mg/dL
Nitrite: POSITIVE — AB
Protein, ur: 30 mg/dL — AB
Specific Gravity, Urine: 1.017 (ref 1.005–1.030)
WBC, UA: >50 WBC/hpf (ref 0–5)
pH: 5 (ref 5.0–8.0)

## 2023-06-20 LAB — CBC
HCT: 37.1 % (ref 36.0–46.0)
Hemoglobin: 12 g/dL (ref 12.0–15.0)
MCH: 29.7 pg (ref 26.0–34.0)
MCHC: 32.3 g/dL (ref 30.0–36.0)
MCV: 91.8 fL (ref 80.0–100.0)
Platelets: 235 10*3/uL (ref 150–400)
RBC: 4.04 MIL/uL (ref 3.87–5.11)
RDW: 14.3 % (ref 11.5–15.5)
WBC: 8.7 10*3/uL (ref 4.0–10.5)
nRBC: 0 % (ref 0.0–0.2)

## 2023-06-20 MED ORDER — KETOROLAC TROMETHAMINE 15 MG/ML IJ SOLN
15.0000 mg | Freq: Once | INTRAMUSCULAR | Status: AC
  Administered 2023-06-20: 15 mg via INTRAMUSCULAR
  Filled 2023-06-20: qty 1

## 2023-06-20 MED ORDER — CYCLOBENZAPRINE HCL 5 MG PO TABS: 10.0000 mg | ORAL_TABLET | Freq: Two times a day (BID) | ORAL | 0 refills | Status: DC | PRN

## 2023-06-20 MED ORDER — LIDOCAINE 5 % EX PTCH
1.0000 | MEDICATED_PATCH | CUTANEOUS | Status: DC
  Administered 2023-06-20: 1 via TRANSDERMAL
  Filled 2023-06-20: qty 1

## 2023-06-20 MED ORDER — KETOROLAC TROMETHAMINE 10 MG PO TABS: 10.0000 mg | ORAL_TABLET | Freq: Four times a day (QID) | ORAL | 0 refills | Status: DC | PRN

## 2023-06-20 MED ORDER — CEPHALEXIN 500 MG PO CAPS: 500.0000 mg | ORAL_CAPSULE | Freq: Four times a day (QID) | ORAL | 0 refills | Status: DC

## 2023-06-20 NOTE — ED Triage Notes (Signed)
Pt c/o lower back pain radiating into left leg x 2 weeks. Pt states pain has gotten worse and left leg feels numb intermittently. Pt states she has had increasing urinary incontinence and had a UTI a week ago.

## 2023-06-20 NOTE — ED Provider Notes (Signed)
[place] EMERGENCY DEPARTMENT AT [facility] Provider Note   CSN: [id number] Arrival date & time: 06/20/23  4098     History  Chief Complaint  Patient presents with   Back Pain    Amy Frey is a 57 y.o. female, depression, migraines, vertigo, kidney stone, chronic back pain.  Patient presents to ED for evaluation of left-sided low back pain that radiates into her left leg as well as dysuria.  Patient reports that for the last 2 weeks she has had on and off issues with left-sided low back pain radiating down into her left buttocks and then her anterior thigh and anterior shin ending in her ankle.  She reports intermittent numbness to her anterior thigh.  States that in the last 4 days her pain has acutely worsened and is now constant.  She reports that she works at [employer] and does do heavy lifting about twice a week.  She denies bowel or bladder incontinence, groin numbness or distal weakness.  She reports that she is been taking naproxen at home without relief.  States that she is unable to lie flat on her bed at home without pain and so sitting up in a chair has been how she is sleeping, denies SOB or leg swelling.  She goes on to state that for the last 1 week she has also had dysuria.  Reports history of kidney stones back in April.  States that she is taking Azo at home but it is not relieving her symptoms.  Denies fevers, nausea, vomiting, lower abdominal pain, blood in urine.   Back Pain Associated symptoms: dysuria   Associated symptoms: no fever        Home Medications Prior to Admission medications   Medication Sig Start Date End Date Taking? Authorizing Provider  cephALEXin (KEFLEX) 500 MG capsule Take 1 capsule (500 mg total) by mouth 4 (four) times daily. 06/20/23  Yes Al Decant, PA-C  cyclobenzaprine (FLEXERIL) 5 MG tablet Take 2 tablets (10 mg total) by mouth 2 (two) times daily as needed for muscle spasms. 06/20/23  Yes Al Decant, PA-C  ketorolac (TORADOL) 10 MG tablet Take 1 tablet (10 mg total) by mouth every 6 (six) hours as needed for moderate pain (pain score 4-6). 06/20/23  Yes Al Decant, PA-C  gabapentin (NEURONTIN) 100 MG capsule Take 1 capsule (100 mg total) by mouth 3 (three) times daily. Patient not taking: Reported on 10/17/2022 04/07/22   Storm Frisk, MD  HYDROcodone-acetaminophen (NORCO/VICODIN) 5-325 MG tablet Take 2 tablets by mouth every 4 (four) hours as needed. 11/20/22   Charlynne Pander, MD  Multiple Vitamin (MULTIVITAMIN WITH MINERALS) TABS tablet Take 1 tablet by mouth daily.    [provider]  naproxen (NAPROSYN) 500 MG tablet Take 1 tablet (500 mg total) by mouth 2 (two) times daily with a meal. 10/17/22   Claiborne Rigg, NP  ondansetron (ZOFRAN-ODT) 4 MG disintegrating tablet 4mg  ODT q4 hours prn nausea/vomit 11/20/22   Charlynne Pander, MD  tamsulosin (FLOMAX) 0.4 MG CAPS capsule Take 1 capsule (0.4 mg total) by mouth daily. 11/20/22   Charlynne Pander, MD      Allergies    Patient has no known allergies.    Review of Systems   Review of Systems  Constitutional:  Negative for fever.  Genitourinary:  Positive for dysuria. Negative for flank pain.  Musculoskeletal:  Positive for back pain and myalgias.  All other systems reviewed and  are negative.   Physical Exam Updated Vital Signs BP 108/81 (BP Location: Right Arm)   Pulse 83   Temp 98 F (36.7 C)   Resp 18   Ht 4\' 11"  (1.499 m)   Wt 91.6 kg   SpO2 98%   BMI 40.80 kg/m  Physical Exam Vitals and nursing note reviewed.  Constitutional:      General: She is not in acute distress.    Appearance: Normal appearance. She is not ill-appearing, toxic-appearing or diaphoretic.  HENT:     Head: Normocephalic and atraumatic.     Nose: Nose normal.     Mouth/Throat:     Mouth: Mucous membranes are moist.     Pharynx: Oropharynx is clear.  Eyes:     Extraocular Movements: Extraocular movements intact.      Conjunctiva/sclera: Conjunctivae normal.     Pupils: Pupils are equal, round, and reactive to light.  Cardiovascular:     Rate and Rhythm: Normal rate and regular rhythm.  Pulmonary:     Effort: Pulmonary effort is normal.     Breath sounds: Normal breath sounds. No wheezing.  Abdominal:     General: Abdomen is flat. Bowel sounds are normal.     Palpations: Abdomen is soft.     Tenderness: There is no abdominal tenderness. There is no right CVA tenderness or left CVA tenderness.  Musculoskeletal:     Cervical back: Normal range of motion and neck supple. No tenderness.       Back:  Skin:    General: Skin is warm and dry.     Capillary Refill: Capillary refill takes less than 2 seconds.  Neurological:     General: No focal deficit present.     Mental Status: She is alert and oriented to person, place, and time.     GCS: GCS eye subscore is 4. GCS verbal subscore is 5. GCS motor subscore is 6.     Cranial Nerves: Cranial nerves 2-12 are intact. No cranial nerve deficit.     Sensory: Sensation is intact. No sensory deficit.     Motor: Motor function is intact. No weakness.     Comments: 5/5 strength to bilateral lower extremities.     ED Results / Procedures / Treatments   Labs (all labs ordered are listed, but only abnormal results are displayed) Labs Reviewed  URINALYSIS, ROUTINE W REFLEX MICROSCOPIC - Abnormal; Notable for the following components:      Result Value   APPearance CLOUDY (*)    Hgb urine dipstick MODERATE (*)    Protein, ur 30 (*)    Nitrite POSITIVE (*)    Leukocytes,Ua LARGE (*)    Bacteria, UA MANY (*)    All other components within normal limits  COMPREHENSIVE METABOLIC PANEL - Abnormal; Notable for the following components:   Glucose, Bld 145 (*)    All other components within normal limits  URINE CULTURE  CBC    EKG None  Radiology CT Renal Stone Study  Result Date: 06/20/2023 CLINICAL DATA:  57 year old female with low back pain  radiating to the left leg. Abdomen/flank pain. EXAM: CT ABDOMEN AND PELVIS WITHOUT CONTRAST TECHNIQUE: Multidetector CT imaging of the abdomen and pelvis was performed following the standard protocol without IV contrast. RADIATION DOSE REDUCTION: This exam was performed according to the departmental dose-optimization program which includes automated exposure control, adjustment of the mA and/or kV according to patient size and/or use of iterative reconstruction technique. COMPARISON:  CT Abdomen and Pelvis  11/20/2022. FINDINGS: Lower chest: Negative. Hepatobiliary: Negative noncontrast liver and gallbladder. Pancreas: Negative. Spleen: Negative. Adrenals/Urinary Tract: 2.5 cm left adrenal nodule with -8 Hounsfield unit density consistent with benign adenoma is stable (no follow-up imaging recommended). Negative right adrenal gland. Nonobstructed kidneys. Punctate right lower pole nephrolithiasis on series 6, image 59. No other convincing nephrolithiasis. Bilateral ureters are decompressed in the abdomen. Multiple pelvic phleboliths appear stable. No distal ureteral enlargement is evident. Unremarkable bladder. Stomach/Bowel: Redundant large bowel, especially the sigmoid colon. Little retained stool distally. Moderate transverse colon redundancy and retained stool. Similar right colon redundancy. Diminutive or absent appendix. No large bowel inflammation. Decompressed terminal ileum. No dilated small bowel. Rectus muscle diastasis without ventral abdominal hernia. Noncontrast stomach and duodenum are within normal limits. No free air or free fluid. Vascular/Lymphatic: Faint Calcified aortic atherosclerosis. Normal caliber abdominal aorta. No lymphadenopathy identified. Reproductive: Negative noncontrast appearance.  Pelvic phleboliths. Other: No pelvis free fluid. Musculoskeletal: Lower thoracic disc and endplate degeneration. Mild to moderate lumbar facet hypertrophy. No acute osseous abnormality identified.  IMPRESSION: 1. Punctate right nephrolithiasis. No obstructive uropathy. No other convincing urinary calculus. 2. No acute or inflammatory process identified in the noncontrast abdomen or pelvis. Electronically Signed   By: Odessa Fleming M.D.   On: 06/20/2023 07:55    Procedures Procedures   Medications Ordered in ED Medications  lidocaine (LIDODERM) 5 % 1 patch (1 patch Transdermal Patch Applied 06/20/23 0807)  ketorolac (TORADOL) 15 MG/ML injection 15 mg (15 mg Intramuscular Given 06/20/23 0807)    ED Course/ Medical Decision Making/ A&P   Medical Decision Making Amount and/or Complexity of Data Reviewed Labs: ordered. Radiology: ordered.  Risk Prescription drug management.   57 year old female presents ED for evaluation with complaints.  Will see HPI for further details.  On exam patient afebrile and nontachycardic.  Her lung sounds are clear bilaterally and she is not hypoxic.  Abdomen soft and compressible throughout, negative CVA tenderness bilaterally.  Neuroexam at baseline with 5 out of 5 strength bilateral lower extremities.  She has tenderness to the left lower portion of her back which she states radiate into her buttocks.  She is able to ambulate however does limp.  Reports that her pain is worse with ambulation.  States that she has a history of chronic back pain and this physical flareup of her chronic back pain.  Also reporting dysuria but denies any fevers, nausea or vomiting, lower abdominal pain, vaginal discharge.  Will treat with Toradol, Lidoderm patch.  Will scan patient for kidney stone as she reports recent history of kidney stones, has nitrite positive urine with leukocytes.  Will collect lab work to ensure no leukocytosis or elevated creatinine.  Patient reassessed and states that her pain is much better controlled at this time.  Able to ambulate without difficulty.  Suspect flare of chronic back pain.  CBC shows no leukocytosis, no anemia.  Metabolic panel shows  elevated glucose however no elevated creatinine.  No elevated LFTs.  Urinalysis shows nitrite positive urine, leukocytes.  Will culture patient urine.  CT renal stone study shows no evidence of nephrolithiasis.  At this time, suspect patient low back pain is secondary to her chronic back pain.  Will send her home with Toradol, Flexeril and advised her to purchase Salonpas patches.  Will send patient home as well on Keflex for UTI.  Advised to follow-up with her PCP.  Encouraged to return with any new or worsening symptoms.  She voices understanding.  She is stable to  discharge.   Final Clinical Impression(s) / ED Diagnoses Final diagnoses:  Chronic left-sided low back pain with left-sided sciatica  Lower urinary tract infectious disease    Rx / DC Orders ED Discharge Orders          Ordered    cephALEXin (KEFLEX) 500 MG capsule  4 times daily        06/20/23 1000    cyclobenzaprine (FLEXERIL) 5 MG tablet  2 times daily PRN        06/20/23 1000    ketorolac (TORADOL) 10 MG tablet  Every 6 hours PRN        06/20/23 1000              Clent Ridges 06/20/23 1001    Linwood Dibbles, MD 06/20/23 1712

## 2023-06-20 NOTE — ED Notes (Signed)
Patient transported to CT 

## 2023-06-20 NOTE — Discharge Instructions (Signed)
It was a pleasure taking part in your care today.  As we discussed, you have a UTI.  Please begin taking Keflex 4 times a day for the next 7 days.  Please take Toradol every 6 hours as needed for pain in your back.  Please take Flexeril, a muscle relaxer, twice a day as needed.  Do not drive or operate heavy machinery while taking Flexeril.  You may also purchase Salonpas patches which are over-the-counter.  Follow-up with Dr. Delford Field, your PCP.  Please return with any new or worsening symptom such as fevers.

## 2023-06-22 LAB — URINE CULTURE: Culture: >=100000 — AB

## 2023-06-23 ENCOUNTER — Telehealth (HOSPITAL_BASED_OUTPATIENT_CLINIC_OR_DEPARTMENT_OTHER): Payer: Self-pay | Admitting: *Deleted

## 2023-06-23 NOTE — Telephone Encounter (Signed)
Post ED Visit - Positive Culture Follow-up  Culture report reviewed by antimicrobial stewardship pharmacist: [facility] [x]  Estill Batten, Pharm.D. []  Celedonio Miyamoto, Pharm.D., BCPS AQ-ID []  Garvin Fila, Pharm.D., BCPS []  Georgina Pillion, Pharm.D., BCPS []  [place], [address].D., BCPS, AAHIVP []  Estella Husk, Pharm.D., BCPS, AAHIVP []  Lysle Pearl, PharmD, BCPS []  Phillips Climes, PharmD, BCPS []  Agapito Games, PharmD, BCPS []  Verlan Friends, PharmD []  Mervyn Gay, PharmD, BCPS []  Vinnie Level, PharmD  [facility] []  Len Childs, PharmD []  Greer Pickerel, PharmD []  Adalberto Cole, PharmD []  Perlie Gold, Rph []  Lonell Face) Jean Rosenthal, PharmD []  Earl Many, PharmD []  Junita Push, PharmD []  Dorna Leitz, PharmD []  Terrilee Files, PharmD []  Lynann Beaver, PharmD []  Keturah Barre, PharmD []  Loralee Pacas, PharmD []  Bernadene Person, PharmD   Positive urine culture Treated with Cephalexin, organism sensitive to the same and no further patient follow-up is required at this time.  Virl Axe [facility] 06/23/2023, 10:22 AM

## 2023-07-19 ENCOUNTER — Other Ambulatory Visit: Payer: Self-pay | Admitting: Nurse Practitioner

## 2023-07-19 DIAGNOSIS — G5603 Carpal tunnel syndrome, bilateral upper limbs: Secondary | ICD-10-CM

## 2023-07-25 ENCOUNTER — Encounter (HOSPITAL_COMMUNITY): Payer: Self-pay

## 2023-07-25 ENCOUNTER — Other Ambulatory Visit: Payer: Self-pay

## 2023-07-25 ENCOUNTER — Emergency Department (HOSPITAL_COMMUNITY): Payer: Commercial Managed Care - HMO

## 2023-07-25 ENCOUNTER — Emergency Department (HOSPITAL_COMMUNITY)
Admission: EM | Admit: 2023-07-25 | Discharge: 2023-07-25 | Disposition: A | Discharge status: Home / Self Care | Payer: Commercial Managed Care - HMO | Attending: Emergency Medicine | Admitting: Emergency Medicine

## 2023-07-25 DIAGNOSIS — R059 Cough, unspecified: Secondary | ICD-10-CM | POA: Diagnosis present

## 2023-07-25 DIAGNOSIS — Z1152 Encounter for screening for COVID-19: Secondary | ICD-10-CM | POA: Insufficient documentation

## 2023-07-25 DIAGNOSIS — J111 Influenza due to unidentified influenza virus with other respiratory manifestations: Secondary | ICD-10-CM | POA: Insufficient documentation

## 2023-07-25 DIAGNOSIS — J069 Acute upper respiratory infection, unspecified: Secondary | ICD-10-CM

## 2023-07-25 LAB — CBC WITH DIFFERENTIAL/PLATELET
Abs Immature Granulocytes: 0.02 10*3/uL (ref 0.00–0.07)
Basophils Absolute: 0 10*3/uL (ref 0.0–0.1)
Basophils Relative: 1 %
Eosinophils Absolute: 0.1 10*3/uL (ref 0.0–0.5)
Eosinophils Relative: 3 %
HCT: 38.8 % (ref 36.0–46.0)
Hemoglobin: 12.3 g/dL (ref 12.0–15.0)
Immature Granulocytes: 1 %
Lymphocytes Relative: 15 %
Lymphs Abs: 0.6 10*3/uL — ABNORMAL LOW (ref 0.7–4.0)
MCH: 28.9 pg (ref 26.0–34.0)
MCHC: 31.7 g/dL (ref 30.0–36.0)
MCV: 91.3 fL (ref 80.0–100.0)
Monocytes Absolute: 0.5 10*3/uL (ref 0.1–1.0)
Monocytes Relative: 10 %
Neutro Abs: 3.1 10*3/uL (ref 1.7–7.7)
Neutrophils Relative %: 70 %
Platelets: 189 10*3/uL (ref 150–400)
RBC: 4.25 MIL/uL (ref 3.87–5.11)
RDW: 15 % (ref 11.5–15.5)
WBC: 4.4 10*3/uL (ref 4.0–10.5)
nRBC: 0 % (ref 0.0–0.2)

## 2023-07-25 LAB — BASIC METABOLIC PANEL
Anion gap: 8 (ref 5–15)
BUN: 10 mg/dL (ref 6–20)
CO2: 26 mmol/L (ref 22–32)
Calcium: 9.4 mg/dL (ref 8.9–10.3)
Chloride: 102 mmol/L (ref 98–111)
Creatinine, Ser: 0.75 mg/dL (ref 0.44–1.00)
GFR, Estimated: >60 mL/min (ref >60)
Glucose, Bld: 96 mg/dL (ref 70–99)
Potassium: 4.3 mmol/L (ref 3.5–5.1)
Sodium: 136 mmol/L (ref 135–145)

## 2023-07-25 LAB — RESP PANEL BY RT-PCR (RSV, FLU A&B, COVID)  RVPGX2
Influenza A by PCR: POSITIVE — AB
Influenza B by PCR: NEGATIVE
Resp Syncytial Virus by PCR: NEGATIVE
SARS Coronavirus 2 by RT PCR: NEGATIVE

## 2023-07-25 MED ORDER — ACETAMINOPHEN 500 MG PO TABS
ORAL_TABLET | ORAL | Status: AC
  Filled 2023-07-25: qty 2

## 2023-07-25 MED ORDER — ACETAMINOPHEN 500 MG PO TABS
1000.0000 mg | ORAL_TABLET | Freq: Once | ORAL | Status: AC
  Administered 2023-07-25: 1000 mg via ORAL

## 2023-07-25 NOTE — ED Triage Notes (Signed)
Reports cough x 3-4 days but last night developed a fever.  And woke up with a fever.  Reports non productive cough and then had nausea this morning with 1 episode of vomiting.

## 2023-07-25 NOTE — ED Provider Notes (Signed)
[place] EMERGENCY DEPARTMENT AT [facility] Provider Note   CSN: [id number] Arrival date & time: 07/25/23  1035     History  Chief Complaint  Patient presents with   URI    Amy Frey is a 57 y.o. female with PMHx migraines, arthritis who presents to ED concerned for cough x3-4 days also states that she's been experiencing some nausea, vomiting, fevers starting today. Also endorses SOB that happens when her nose becomes congested. Denies Hx COPD/asthma. No abdominal pain, diarrhea, hematemesis. Patient ambulatory in ED.   URI Presenting symptoms: cough        Home Medications Prior to Admission medications   Medication Sig Start Date End Date Taking? Authorizing Provider  cephALEXin (KEFLEX) 500 MG capsule Take 1 capsule (500 mg total) by mouth 4 (four) times daily. 06/20/23   Al Decant, PA-C  cyclobenzaprine (FLEXERIL) 5 MG tablet Take 2 tablets (10 mg total) by mouth 2 (two) times daily as needed for muscle spasms. 06/20/23   Al Decant, PA-C  gabapentin (NEURONTIN) 100 MG capsule Take 1 capsule (100 mg total) by mouth 3 (three) times daily. Patient not taking: Reported on 10/17/2022 04/07/22   Storm Frisk, MD  HYDROcodone-acetaminophen (NORCO/VICODIN) 5-325 MG tablet Take 2 tablets by mouth every 4 (four) hours as needed. 11/20/22   Charlynne Pander, MD  ketorolac (TORADOL) 10 MG tablet Take 1 tablet (10 mg total) by mouth every 6 (six) hours as needed for moderate pain (pain score 4-6). 06/20/23   Al Decant, PA-C  Multiple Vitamin (MULTIVITAMIN WITH MINERALS) TABS tablet Take 1 tablet by mouth daily.    [provider]  naproxen (NAPROSYN) 500 MG tablet TAKE 1 TABLET BY MOUTH TWICE DAILY WITH A MEAL 07/20/23   Hoy Register, MD  ondansetron (ZOFRAN-ODT) 4 MG disintegrating tablet 4mg  ODT q4 hours prn nausea/vomit 11/20/22   Charlynne Pander, MD  tamsulosin (FLOMAX) 0.4 MG CAPS capsule Take 1 capsule (0.4 mg  total) by mouth daily. 11/20/22   Charlynne Pander, MD      Allergies    Patient has no known allergies.    Review of Systems   Review of Systems  Respiratory:  Positive for cough.     Physical Exam Updated Vital Signs BP (!) 124/94 (BP Location: Right Arm)   Pulse (!) 104   Temp 99.9 F (37.7 C)   Resp 18   Ht 4\' 11"  (1.499 m)   Wt 91.6 kg   SpO2 97%   BMI 40.80 kg/m  Physical Exam Vitals and nursing note reviewed.  Constitutional:      General: She is not in acute distress.    Appearance: She is not ill-appearing or toxic-appearing.  HENT:     Head: Normocephalic and atraumatic.     Mouth/Throat:     Mouth: Mucous membranes are moist.  Eyes:     General: No scleral icterus.       Right eye: No discharge.        Left eye: No discharge.     Conjunctiva/sclera: Conjunctivae normal.  Cardiovascular:     Rate and Rhythm: Normal rate and regular rhythm.     Pulses: Normal pulses.     Heart sounds: Normal heart sounds. No murmur heard. Pulmonary:     Effort: Pulmonary effort is normal. No respiratory distress.     Breath sounds: Normal breath sounds. No wheezing, rhonchi or rales.  Abdominal:     General: Abdomen is flat.  Musculoskeletal:     Right lower leg: No edema.     Left lower leg: No edema.  Skin:    General: Skin is warm and dry.     Findings: No rash.  Neurological:     General: No focal deficit present.     Mental Status: She is alert. Mental status is at baseline.  Psychiatric:        Mood and Affect: Mood normal.        Behavior: Behavior normal.     ED Results / Procedures / Treatments   Labs (all labs ordered are listed, but only abnormal results are displayed) Labs Reviewed  RESP PANEL BY RT-PCR (RSV, FLU A&B, COVID)  RVPGX2 - Abnormal; Notable for the following components:      Result Value   Influenza A by PCR POSITIVE (*)    All other components within normal limits  CBC WITH DIFFERENTIAL/PLATELET - Abnormal; Notable for the  following components:   Lymphs Abs 0.6 (*)    All other components within normal limits  BASIC METABOLIC PANEL    EKG None  Radiology DG Chest 2 View Result Date: 07/25/2023 CLINICAL DATA:  57 year old female with history of cough. EXAM: CHEST - 2 VIEW COMPARISON:  Chest x-ray 02/09/2020. FINDINGS: Lung volumes are normal. No consolidative airspace disease. No pleural effusions. No pneumothorax. No pulmonary nodule or mass noted. Pulmonary vasculature and the cardiomediastinal silhouette are within normal limits. IMPRESSION: No radiographic evidence of acute cardiopulmonary disease. Electronically Signed   By: Trudie Reed M.D.   On: 07/25/2023 12:01    Procedures Procedures    Medications Ordered in ED Medications  acetaminophen (TYLENOL) 500 MG tablet (has no administration in time range)  acetaminophen (TYLENOL) tablet 1,000 mg (1,000 mg Oral Given 07/25/23 1312)    ED Course/ Medical Decision Making/ A&P                                 Medical Decision Making Risk OTC drugs.    This patient presents to the ED for concern of cough, fever, nausea, vomiting, this involves an extensive number of treatment options, and is a complaint that carries with it a high risk of complications and morbidity.  The differential diagnosis includes Flu/COVID/RSV, tuberculosis, sinusitis, pneumonia, meningitis.   Co morbidities that complicate the patient evaluation  migraines, arthritis   Additional history obtained:  Dr. Delford Field PCP   Lab Tests:  I Ordered, and personally interpreted labs.  The pertinent results include:   CBC: No concern for anemia or leukocytosis BMP: no concern for electrolyte abnormality; no concern for kidney damage Respiratory Panel: Flu positive   Imaging Studies ordered:  I ordered imaging studies including  -chest xray: to assess for process contributing to patient's symptoms  I independently visualized and interpreted imaging  I agree with  the radiologist interpretation    Problem List / ED Course / Critical interventions / Medication management  Patient presents to ED concern for cough, fever, nausea, vomiting that has been progressing over the past 3 to 4 days.  Patient does have mild tachycardia at 104 bpm - she is overall well-appearing and does not appear septic at this time.  Rest of physical exam reassuring. CBC without leukocytosis or anemia.  BMP reassuring.  Respiratory panel positive for flu A.  Chest x-ray without acute cardiopulmonary disease. Labs are reassuring that patient is not critically dehydrated. Her mild tachycardia does  appear to be related to mild dehydration - patient is tolerating PO intake and capable of oral rehydration. It appears the patient's symptoms are due to her viral illness.  Educated patient on using over-the-counter medicines for symptom control.  Provided patient with a dose of Tylenol in ED which she tolerated well.  Recommended following up with PCP.  Patient verbalized understanding of plan. I have reviewed the patients home medicines and have made adjustments as needed Patient afebrile with stable vitals.  Provided with return precautions.  Discharged in good condition.   DDx: These are considered less likely due to history of present illness and physical exam findings -PNA: Lungs clear to auscultation bilaterally -Meningitis: patient's symptoms, vital signs, physical exam findings including lack of meningismus seem grossly less consistent at this time   Social Determinants of Health:  none    This office note has been dictated using Teaching laboratory technician. Unfortunately, this method of dictation can sometimes lead to typographical or grammatical errors. I apologize for your inconvenience in advance if this occurs. Please do not hesitate to reach out to me if clarification is needed.          Final Clinical Impression(s) / ED Diagnoses Final diagnoses:  None     Rx / DC Orders ED Discharge Orders     None         Dorthy Cooler, [place] 07/25/23 1433

## 2023-07-25 NOTE — ED Provider Triage Note (Signed)
Emergency Medicine Provider Triage Evaluation Note  Tyrihanna Julson , a 57 y.o. female  was evaluated in triage.  Pt complains of URI.  Review of Systems  Positive:  Negative:   Physical Exam  BP (!) 124/94 (BP Location: Right Arm)   Pulse (!) 104   Temp 99.9 F (37.7 C)   Resp 18   Ht 4\' 11"  (1.499 m)   Wt 91.6 kg   SpO2 97%   BMI 40.80 kg/m  Gen:   Awake, no distress   Resp:  Normal effort  MSK:   Moves extremities without difficulty  Other:    Medical Decision Making  Medically screening exam initiated at 11:11 AM.  Appropriate orders placed.  Rhyse Biles was informed that the remainder of the evaluation will be completed by another provider, this initial triage assessment does not replace that evaluation, and the importance of remaining in the ED until their evaluation is complete.  Cough x3-4 days. Endorses SOB, fever, nausea, and vomiting that started today. No abdominal pain. No diarrhea. No hematemesis. Denies hx of COPD/asthma.   Dorthy Cooler, [place] 07/25/23 1112

## 2023-07-25 NOTE — Discharge Instructions (Signed)
 It was a pleasure caring for you today.  Please follow-up with your primary care provider.  Seek emergency care if experiencing any new or worsening symptoms.  Alternating between 650 mg Tylenol and 400 mg Advil: The best way to alternate taking Acetaminophen (example Tylenol) and Ibuprofen (example Advil/Motrin) is to take them 3 hours apart. For example, if you take ibuprofen at 6 am you can then take Tylenol at 9 am. You can continue this regimen throughout the day, making sure you do not exceed the recommended maximum dose for each drug.

## 2023-08-22 ENCOUNTER — Encounter (HOSPITAL_COMMUNITY): Payer: Self-pay

## 2023-08-22 ENCOUNTER — Emergency Department (HOSPITAL_COMMUNITY)
Admission: EM | Admit: 2023-08-22 | Discharge: 2023-08-22 | Disposition: A | Discharge status: Home / Self Care | Payer: Commercial Managed Care - HMO | Source: Home / Self Care | Attending: Emergency Medicine | Admitting: Emergency Medicine

## 2023-08-22 ENCOUNTER — Emergency Department (HOSPITAL_COMMUNITY): Payer: Commercial Managed Care - HMO

## 2023-08-22 ENCOUNTER — Other Ambulatory Visit: Payer: Self-pay

## 2023-08-22 DIAGNOSIS — Z79899 Other long term (current) drug therapy: Secondary | ICD-10-CM | POA: Diagnosis not present

## 2023-08-22 DIAGNOSIS — Z6841 Body Mass Index (BMI) 40.0 and over, adult: Secondary | ICD-10-CM | POA: Diagnosis not present

## 2023-08-22 DIAGNOSIS — R8289 Other abnormal findings on cytological and histological examination of urine: Secondary | ICD-10-CM | POA: Insufficient documentation

## 2023-08-22 DIAGNOSIS — N12 Tubulo-interstitial nephritis, not specified as acute or chronic: Secondary | ICD-10-CM | POA: Diagnosis not present

## 2023-08-22 DIAGNOSIS — D649 Anemia, unspecified: Secondary | ICD-10-CM | POA: Diagnosis not present

## 2023-08-22 DIAGNOSIS — R509 Fever, unspecified: Secondary | ICD-10-CM | POA: Insufficient documentation

## 2023-08-22 DIAGNOSIS — R059 Cough, unspecified: Secondary | ICD-10-CM | POA: Diagnosis present

## 2023-08-22 DIAGNOSIS — R112 Nausea with vomiting, unspecified: Secondary | ICD-10-CM | POA: Diagnosis not present

## 2023-08-22 DIAGNOSIS — N10 Acute tubulo-interstitial nephritis: Secondary | ICD-10-CM | POA: Diagnosis not present

## 2023-08-22 DIAGNOSIS — R350 Frequency of micturition: Secondary | ICD-10-CM | POA: Insufficient documentation

## 2023-08-22 DIAGNOSIS — Z20822 Contact with and (suspected) exposure to covid-19: Secondary | ICD-10-CM | POA: Insufficient documentation

## 2023-08-22 LAB — RESP PANEL BY RT-PCR (RSV, FLU A&B, COVID)  RVPGX2
Influenza A by PCR: NEGATIVE
Influenza B by PCR: NEGATIVE
Resp Syncytial Virus by PCR: NEGATIVE
SARS Coronavirus 2 by RT PCR: NEGATIVE

## 2023-08-22 LAB — URINALYSIS, ROUTINE W REFLEX MICROSCOPIC
Bilirubin Urine: NEGATIVE
Glucose, UA: NEGATIVE mg/dL
Ketones, ur: 5 mg/dL — AB
Nitrite: NEGATIVE
Protein, ur: 30 mg/dL — AB
Specific Gravity, Urine: 1.019 (ref 1.005–1.030)
WBC, UA: >50 WBC/hpf (ref 0–5)
pH: 5 (ref 5.0–8.0)

## 2023-08-22 LAB — BASIC METABOLIC PANEL
Anion gap: 11 (ref 5–15)
BUN: 9 mg/dL (ref 6–20)
CO2: 24 mmol/L (ref 22–32)
Calcium: 9.2 mg/dL (ref 8.9–10.3)
Chloride: 102 mmol/L (ref 98–111)
Creatinine, Ser: 0.81 mg/dL (ref 0.44–1.00)
GFR, Estimated: >60 mL/min (ref >60)
Glucose, Bld: 122 mg/dL — ABNORMAL HIGH (ref 70–99)
Potassium: 3.5 mmol/L (ref 3.5–5.1)
Sodium: 137 mmol/L (ref 135–145)

## 2023-08-22 LAB — CBC WITH DIFFERENTIAL/PLATELET
Abs Immature Granulocytes: 0.03 10*3/uL (ref 0.00–0.07)
Basophils Absolute: 0 10*3/uL (ref 0.0–0.1)
Basophils Relative: 0 %
Eosinophils Absolute: 0.1 10*3/uL (ref 0.0–0.5)
Eosinophils Relative: 1 %
HCT: 36 % (ref 36.0–46.0)
Hemoglobin: 11.7 g/dL — ABNORMAL LOW (ref 12.0–15.0)
Immature Granulocytes: 0 %
Lymphocytes Relative: 14 %
Lymphs Abs: 1.1 10*3/uL (ref 0.7–4.0)
MCH: 29.1 pg (ref 26.0–34.0)
MCHC: 32.5 g/dL (ref 30.0–36.0)
MCV: 89.6 fL (ref 80.0–100.0)
Monocytes Absolute: 0.7 10*3/uL (ref 0.1–1.0)
Monocytes Relative: 8 %
Neutro Abs: 6.2 10*3/uL (ref 1.7–7.7)
Neutrophils Relative %: 77 %
Platelets: 173 10*3/uL (ref 150–400)
RBC: 4.02 MIL/uL (ref 3.87–5.11)
RDW: 15.8 % — ABNORMAL HIGH (ref 11.5–15.5)
WBC: 8.1 10*3/uL (ref 4.0–10.5)
nRBC: 0 % (ref 0.0–0.2)

## 2023-08-22 NOTE — ED Notes (Signed)
Transported to xray

## 2023-08-22 NOTE — Discharge Instructions (Addendum)
Take Tylenol and/or ibuprofen for fever and aches. Drink lots of fluids to avoid dehydration.   Return to the ED with any concerning symptoms at any time. Otherwise, please follow up with your doctor if your temperature remains as high as reported.

## 2023-08-22 NOTE — ED Provider Notes (Signed)
[place] EMERGENCY DEPARTMENT AT [facility] Provider Note   CSN: [id number] Arrival date & time: 08/22/23  1914     History  Chief Complaint  Patient presents with   Cough    Amy Frey is a 58 y.o. female.  Patient to ED for evaluation of sudden onset shaking chills, with fever last night reported by husband at "105.0". No cough, congestion, nausea, vomiting. She reports urinary frequency with large volume water intake but this is not new, and is not associated with dysuria. She reports having the flu 2 weeks ago but had completely recovered.   The history is provided by the patient and the spouse. No language interpreter was used.  Cough      Home Medications Prior to Admission medications   Medication Sig Start Date End Date Taking? Authorizing Provider  cephALEXin (KEFLEX) 500 MG capsule Take 1 capsule (500 mg total) by mouth 4 (four) times daily. 06/20/23   Al Decant, PA-C  cyclobenzaprine (FLEXERIL) 5 MG tablet Take 2 tablets (10 mg total) by mouth 2 (two) times daily as needed for muscle spasms. 06/20/23   Al Decant, PA-C  gabapentin (NEURONTIN) 100 MG capsule Take 1 capsule (100 mg total) by mouth 3 (three) times daily. Patient not taking: Reported on 10/17/2022 04/07/22   Storm Frisk, MD  HYDROcodone-acetaminophen (NORCO/VICODIN) 5-325 MG tablet Take 2 tablets by mouth every 4 (four) hours as needed. 11/20/22   Charlynne Pander, MD  ketorolac (TORADOL) 10 MG tablet Take 1 tablet (10 mg total) by mouth every 6 (six) hours as needed for moderate pain (pain score 4-6). 06/20/23   Al Decant, PA-C  Multiple Vitamin (MULTIVITAMIN WITH MINERALS) TABS tablet Take 1 tablet by mouth daily.    [provider]  naproxen (NAPROSYN) 500 MG tablet TAKE 1 TABLET BY MOUTH TWICE DAILY WITH A MEAL 07/20/23   Hoy Register, MD  ondansetron (ZOFRAN-ODT) 4 MG disintegrating tablet 4mg  ODT q4 hours prn nausea/vomit 11/20/22    Charlynne Pander, MD  tamsulosin (FLOMAX) 0.4 MG CAPS capsule Take 1 capsule (0.4 mg total) by mouth daily. 11/20/22   Charlynne Pander, MD      Allergies    Patient has no known allergies.    Review of Systems   Review of Systems  Respiratory:  Positive for cough.     Physical Exam Updated Vital Signs BP 114/84   Pulse 98   Temp 100.1 F (37.8 C)   Resp 17   Ht 4\' 11"  (1.499 m)   Wt 91.6 kg   SpO2 99%   BMI 40.80 kg/m  Physical Exam Vitals and nursing note reviewed.  Constitutional:      Appearance: She is well-developed.  HENT:     Head: Normocephalic.  Cardiovascular:     Rate and Rhythm: Normal rate and regular rhythm.     Heart sounds: No murmur heard. Pulmonary:     Effort: Pulmonary effort is normal.     Breath sounds: Normal breath sounds. No wheezing, rhonchi or rales.  Abdominal:     General: Bowel sounds are normal.     Palpations: Abdomen is soft.     Tenderness: There is no abdominal tenderness. There is no guarding or rebound.  Musculoskeletal:        General: Normal range of motion.     Cervical back: Normal range of motion and neck supple.  Skin:    General: Skin is warm and dry.  Neurological:  General: No focal deficit present.     Mental Status: She is alert and oriented to person, place, and time.     ED Results / Procedures / Treatments   Labs (all labs ordered are listed, but only abnormal results are displayed) Labs Reviewed  CBC WITH DIFFERENTIAL/PLATELET - Abnormal; Notable for the following components:      Result Value   Hemoglobin 11.7 (*)    RDW 15.8 (*)    All other components within normal limits  BASIC METABOLIC PANEL - Abnormal; Notable for the following components:   Glucose, Bld 122 (*)    All other components within normal limits  URINALYSIS, ROUTINE W REFLEX MICROSCOPIC - Abnormal; Notable for the following components:   APPearance CLOUDY (*)    Hgb urine dipstick SMALL (*)    Ketones, ur 5 (*)    Protein,  ur 30 (*)    Leukocytes,Ua MODERATE (*)    Bacteria, UA RARE (*)    All other components within normal limits  RESP PANEL BY RT-PCR (RSV, FLU A&B, COVID)  RVPGX2  URINE CULTURE   Results for orders placed or performed during the hospital encounter of 08/22/23  Resp panel by RT-PCR (RSV, Flu A&B, Covid) Anterior Nasal Swab   Collection Time: 08/22/23  7:06 AM   Specimen: Anterior Nasal Swab  Result Value Ref Range   SARS Coronavirus 2 by RT PCR NEGATIVE NEGATIVE   Influenza A by PCR NEGATIVE NEGATIVE   Influenza B by PCR NEGATIVE NEGATIVE   Resp Syncytial Virus by PCR NEGATIVE NEGATIVE  Urinalysis, Routine w reflex microscopic -Urine, Clean Catch   Collection Time: 08/22/23  7:07 AM  Result Value Ref Range   Color, Urine YELLOW YELLOW   APPearance CLOUDY (A) CLEAR   Specific Gravity, Urine 1.019 1.005 - 1.030   pH 5.0 5.0 - 8.0   Glucose, UA NEGATIVE NEGATIVE mg/dL   Hgb urine dipstick SMALL (A) NEGATIVE   Bilirubin Urine NEGATIVE NEGATIVE   Ketones, ur 5 (A) NEGATIVE mg/dL   Protein, ur 30 (A) NEGATIVE mg/dL   Nitrite NEGATIVE NEGATIVE   Leukocytes,Ua MODERATE (A) NEGATIVE   RBC / HPF 6-10 0 - 5 RBC/hpf   WBC, UA >50 0 - 5 WBC/hpf   Bacteria, UA RARE (A) NONE SEEN   Squamous Epithelial / HPF 21-50 0 - 5 /HPF   Mucus PRESENT   CBC with Differential   Collection Time: 08/22/23  7:16 AM  Result Value Ref Range   WBC 8.1 4.0 - 10.5 K/uL   RBC 4.02 3.87 - 5.11 MIL/uL   Hemoglobin 11.7 (L) 12.0 - 15.0 g/dL   HCT 16.1 09.6 - 04.5 %   MCV 89.6 80.0 - 100.0 fL   MCH 29.1 26.0 - 34.0 pg   MCHC 32.5 30.0 - 36.0 g/dL   RDW 40.9 (H) 81.1 - 91.4 %   Platelets 173 150 - 400 K/uL   nRBC 0.0 0.0 - 0.2 %   Neutrophils Relative % 77 %   Neutro Abs 6.2 1.7 - 7.7 K/uL   Lymphocytes Relative 14 %   Lymphs Abs 1.1 0.7 - 4.0 K/uL   Monocytes Relative 8 %   Monocytes Absolute 0.7 0.1 - 1.0 K/uL   Eosinophils Relative 1 %   Eosinophils Absolute 0.1 0.0 - 0.5 K/uL   Basophils Relative 0  %   Basophils Absolute 0.0 0.0 - 0.1 K/uL   Immature Granulocytes 0 %   Abs Immature Granulocytes 0.03 0.00 - 0.07  K/uL  Basic metabolic panel   Collection Time: 08/22/23  7:16 AM  Result Value Ref Range   Sodium 137 135 - 145 mmol/L   Potassium 3.5 3.5 - 5.1 mmol/L   Chloride 102 98 - 111 mmol/L   CO2 24 22 - 32 mmol/L   Glucose, Bld 122 (H) 70 - 99 mg/dL   BUN 9 6 - 20 mg/dL   Creatinine, Ser 1.61 0.44 - 1.00 mg/dL   Calcium 9.2 8.9 - 09.6 mg/dL   GFR, Estimated >04 >54 mL/min   Anion gap 11 5 - 15    EKG None  Radiology DG Chest 2 View Result Date: 08/22/2023 CLINICAL DATA:  Cough EXAM: CHEST - 2 VIEW COMPARISON:  07/25/2023 FINDINGS: The heart size and mediastinal contours are within normal limits. Both lungs are clear. The visualized skeletal structures are unremarkable. IMPRESSION: No acute abnormality of the lungs. Electronically Signed   By: Jearld Lesch M.D.   On: 08/22/2023 08:25    Procedures Procedures    Medications Ordered in ED Medications - No data to display  ED Course/ Medical Decision Making/ A&P                                 Medical Decision Making Patient with high temperature at home, sudden onset last night. Denies other symptoms.   Well appearing, nontoxic. Awake and alert.  Labs: WBC >50, rare bacteria, squamous cells 21-50 suggesting contamination- culture pending No leukocytosis, hgb 11.7 No electrolyte abnormalities.   Imaging: CXR neg per radiology interpretation  Temp in the ED 100.1. VSS, no evidence to suggest sepsis or evolving sepsis No concerning finding on PE  Discussed with Dr. Rush Landmark. No further evaluation felt indicated. She is stable for discharge home.   Amount and/or Complexity of Data Reviewed Labs: ordered. Radiology: ordered.           Final Clinical Impression(s) / ED Diagnoses Final diagnoses:  Febrile illness    Rx / DC Orders ED Discharge Orders     None         Danne Harbor 08/22/23 0901    Dione Booze, MD 08/22/23 2243

## 2023-08-22 NOTE — ED Triage Notes (Addendum)
Reports had a fever all night.  REports congestion and coughing.  Denies headache.  Reports increased urinary freq.  Denies sick contact. Took motrin at 0600

## 2023-08-23 ENCOUNTER — Encounter (HOSPITAL_COMMUNITY): Payer: Self-pay

## 2023-08-23 ENCOUNTER — Observation Stay (HOSPITAL_COMMUNITY)
Admission: EM | Admit: 2023-08-23 | Discharge: 2023-08-25 | Disposition: A | Discharge status: Home / Self Care | Payer: Commercial Managed Care - HMO | Attending: Internal Medicine | Admitting: Internal Medicine

## 2023-08-23 ENCOUNTER — Emergency Department (HOSPITAL_COMMUNITY): Payer: Commercial Managed Care - HMO

## 2023-08-23 ENCOUNTER — Other Ambulatory Visit: Payer: Self-pay

## 2023-08-23 DIAGNOSIS — R112 Nausea with vomiting, unspecified: Secondary | ICD-10-CM | POA: Insufficient documentation

## 2023-08-23 DIAGNOSIS — Z6841 Body Mass Index (BMI) 40.0 and over, adult: Secondary | ICD-10-CM | POA: Insufficient documentation

## 2023-08-23 DIAGNOSIS — Z79899 Other long term (current) drug therapy: Secondary | ICD-10-CM | POA: Insufficient documentation

## 2023-08-23 DIAGNOSIS — N10 Acute tubulo-interstitial nephritis: Principal | ICD-10-CM | POA: Insufficient documentation

## 2023-08-23 DIAGNOSIS — N12 Tubulo-interstitial nephritis, not specified as acute or chronic: Principal | ICD-10-CM | POA: Insufficient documentation

## 2023-08-23 DIAGNOSIS — D649 Anemia, unspecified: Secondary | ICD-10-CM | POA: Insufficient documentation

## 2023-08-23 DIAGNOSIS — E669 Obesity, unspecified: Secondary | ICD-10-CM | POA: Diagnosis present

## 2023-08-23 LAB — COMPREHENSIVE METABOLIC PANEL
ALT: 16 U/L (ref 0–44)
AST: 25 U/L (ref 15–41)
Albumin: 3.1 g/dL — ABNORMAL LOW (ref 3.5–5.0)
Alkaline Phosphatase: 51 U/L (ref 38–126)
Anion gap: 14 (ref 5–15)
BUN: 8 mg/dL (ref 6–20)
CO2: 22 mmol/L (ref 22–32)
Calcium: 8.6 mg/dL — ABNORMAL LOW (ref 8.9–10.3)
Chloride: 100 mmol/L (ref 98–111)
Creatinine, Ser: 0.76 mg/dL (ref 0.44–1.00)
GFR, Estimated: >60 mL/min (ref >60)
Glucose, Bld: 94 mg/dL (ref 70–99)
Potassium: 3.9 mmol/L (ref 3.5–5.1)
Sodium: 136 mmol/L (ref 135–145)
Total Bilirubin: 0.9 mg/dL (ref 0.0–1.2)
Total Protein: 7 g/dL (ref 6.5–8.1)

## 2023-08-23 LAB — CBC WITH DIFFERENTIAL/PLATELET
Abs Immature Granulocytes: 0.03 10*3/uL (ref 0.00–0.07)
Basophils Absolute: 0 10*3/uL (ref 0.0–0.1)
Basophils Relative: 0 %
Eosinophils Absolute: 0 10*3/uL (ref 0.0–0.5)
Eosinophils Relative: 1 %
HCT: 33.7 % — ABNORMAL LOW (ref 36.0–46.0)
Hemoglobin: 11.2 g/dL — ABNORMAL LOW (ref 12.0–15.0)
Immature Granulocytes: 0 %
Lymphocytes Relative: 15 %
Lymphs Abs: 1.2 10*3/uL (ref 0.7–4.0)
MCH: 29.2 pg (ref 26.0–34.0)
MCHC: 33.2 g/dL (ref 30.0–36.0)
MCV: 88 fL (ref 80.0–100.0)
Monocytes Absolute: 0.9 10*3/uL (ref 0.1–1.0)
Monocytes Relative: 11 %
Neutro Abs: 5.6 10*3/uL (ref 1.7–7.7)
Neutrophils Relative %: 73 %
Platelets: 172 10*3/uL (ref 150–400)
RBC: 3.83 MIL/uL — ABNORMAL LOW (ref 3.87–5.11)
RDW: 15.8 % — ABNORMAL HIGH (ref 11.5–15.5)
WBC: 7.8 10*3/uL (ref 4.0–10.5)
nRBC: 0 % (ref 0.0–0.2)

## 2023-08-23 LAB — URINALYSIS, W/ REFLEX TO CULTURE (INFECTION SUSPECTED)
Bilirubin Urine: NEGATIVE
Glucose, UA: NEGATIVE mg/dL
Ketones, ur: 80 mg/dL — AB
Nitrite: POSITIVE — AB
Protein, ur: 100 mg/dL — AB
Specific Gravity, Urine: 1.016 (ref 1.005–1.030)
WBC, UA: >50 WBC/hpf (ref 0–5)
pH: 5 (ref 5.0–8.0)

## 2023-08-23 LAB — RESP PANEL BY RT-PCR (RSV, FLU A&B, COVID)  RVPGX2
Influenza A by PCR: NEGATIVE
Influenza B by PCR: NEGATIVE
Resp Syncytial Virus by PCR: NEGATIVE
SARS Coronavirus 2 by RT PCR: NEGATIVE

## 2023-08-23 LAB — PROTIME-INR
INR: 1.2 (ref 0.8–1.2)
Prothrombin Time: 14.9 s (ref 11.4–15.2)

## 2023-08-23 LAB — APTT: aPTT: 32 s (ref 24–36)

## 2023-08-23 LAB — I-STAT CG4 LACTIC ACID, ED: Lactic Acid, Venous: 0.5 mmol/L (ref 0.5–1.9)

## 2023-08-23 MED ORDER — ONDANSETRON HCL 4 MG/2ML IJ SOLN
4.0000 mg | Freq: Once | INTRAMUSCULAR | Status: AC
  Administered 2023-08-23: 4 mg via INTRAVENOUS
  Filled 2023-08-23: qty 2

## 2023-08-23 MED ORDER — SODIUM CHLORIDE 0.9 % IV SOLN
2.0000 g | Freq: Once | INTRAVENOUS | Status: AC
  Administered 2023-08-23: 2 g via INTRAVENOUS
  Filled 2023-08-23: qty 20

## 2023-08-23 MED ORDER — ACETAMINOPHEN 325 MG PO TABS
650.0000 mg | ORAL_TABLET | Freq: Once | ORAL | Status: AC
  Administered 2023-08-23: 650 mg via ORAL
  Filled 2023-08-23: qty 2

## 2023-08-23 MED ORDER — CEFTRIAXONE SODIUM 1 G IJ SOLR: 2.0000 g | Freq: Once | INTRAMUSCULAR | Status: DC

## 2023-08-23 MED ORDER — IOHEXOL 350 MG/ML SOLN
75.0000 mL | Freq: Once | INTRAVENOUS | Status: AC | PRN
  Administered 2023-08-23: 75 mL via INTRAVENOUS

## 2023-08-23 NOTE — Sepsis Progress Note (Signed)
Sepsis protocol is being followed by [facility].

## 2023-08-23 NOTE — ED Triage Notes (Addendum)
Patient was seen here yesterday and culture came back with e coli in urine.  Returns for abd pain n/v and fever.

## 2023-08-23 NOTE — ED Provider Triage Note (Signed)
Emergency Medicine Provider Triage Evaluation Note  Seanna Sisler , a 58 y.o. female  was evaluated in triage.  Pt complains of fevers, abdominal pain, nausea, vomiting, dehydration.   Review of Systems  Positive: Abdominal pain, nausea, dehydration Negative: Chest pain, shortness of breath  Physical Exam  BP 128/88 (BP Location: Right Arm)   Pulse (!) 105   Temp (!) 102.8 F (39.3 C)   Resp (!) 22   Ht 4\' 11"  (1.499 m)   Wt 91.6 kg   SpO2 98%   BMI 40.80 kg/m  Gen:   Awake, no distress   Resp:  Normal effort  MSK:   Moves extremities without difficulty  Other:    Medical Decision Making  Medically screening exam initiated at 6:24 PM.  Appropriate orders placed.  Lowana Hable was informed that the remainder of the evaluation will be completed by another provider, this initial triage assessment does not replace that evaluation, and the importance of remaining in the ED until their evaluation is complete.  Patient is febrile, tachycardic. She was seen in the ER recently and has positive urine cultures.  Plan is to her IV antibiotics along with activation of code sepsis and getting basic labs. We will also get CT.   Derwood Kaplan, MD 08/23/23 2122

## 2023-08-24 DIAGNOSIS — N12 Tubulo-interstitial nephritis, not specified as acute or chronic: Principal | ICD-10-CM | POA: Diagnosis present

## 2023-08-24 DIAGNOSIS — N10 Acute tubulo-interstitial nephritis: Secondary | ICD-10-CM | POA: Diagnosis not present

## 2023-08-24 LAB — CREATININE, SERUM
Creatinine, Ser: 0.69 mg/dL (ref 0.44–1.00)
GFR, Estimated: >60 mL/min (ref >60)

## 2023-08-24 LAB — CBC
HCT: 34.8 % — ABNORMAL LOW (ref 36.0–46.0)
Hemoglobin: 11.4 g/dL — ABNORMAL LOW (ref 12.0–15.0)
MCH: 29.3 pg (ref 26.0–34.0)
MCHC: 32.8 g/dL (ref 30.0–36.0)
MCV: 89.5 fL (ref 80.0–100.0)
Platelets: 159 10*3/uL (ref 150–400)
RBC: 3.89 MIL/uL (ref 3.87–5.11)
RDW: 15.9 % — ABNORMAL HIGH (ref 11.5–15.5)
WBC: 7.3 10*3/uL (ref 4.0–10.5)
nRBC: 0 % (ref 0.0–0.2)

## 2023-08-24 LAB — URINE CULTURE
Culture: >=100000 — AB
Culture: NO GROWTH
Special Requests: NORMAL

## 2023-08-24 LAB — HIV ANTIBODY (ROUTINE TESTING W REFLEX): HIV Screen 4th Generation wRfx: NONREACTIVE

## 2023-08-24 MED ORDER — HYDROMORPHONE HCL 1 MG/ML IJ SOLN: 0.5000 mg | INTRAMUSCULAR | Status: DC | PRN

## 2023-08-24 MED ORDER — OXYCODONE HCL 5 MG PO TABS
5.0000 mg | ORAL_TABLET | Freq: Four times a day (QID) | ORAL | Status: DC | PRN
  Administered 2023-08-24 (×2): 5 mg via ORAL
  Filled 2023-08-24 (×2): qty 1

## 2023-08-24 MED ORDER — POLYETHYLENE GLYCOL 3350 17 G PO PACK: 17.0000 g | PACK | Freq: Every day | ORAL | Status: DC | PRN

## 2023-08-24 MED ORDER — SODIUM CHLORIDE 0.9 % IV BOLUS
1000.0000 mL | Freq: Once | INTRAVENOUS | Status: AC
  Administered 2023-08-24: 1000 mL via INTRAVENOUS

## 2023-08-24 MED ORDER — SODIUM CHLORIDE 0.9 % IV SOLN
2.0000 g | Freq: Once | INTRAVENOUS | Status: AC
  Administered 2023-08-24: 2 g via INTRAVENOUS
  Filled 2023-08-24: qty 20

## 2023-08-24 MED ORDER — SODIUM CHLORIDE 0.9 % IV SOLN: INTRAVENOUS | Status: AC

## 2023-08-24 MED ORDER — MELATONIN 5 MG PO TABS: 5.0000 mg | ORAL_TABLET | Freq: Every evening | ORAL | Status: DC | PRN

## 2023-08-24 MED ORDER — ONDANSETRON HCL 4 MG/2ML IJ SOLN
4.0000 mg | Freq: Once | INTRAMUSCULAR | Status: AC
  Administered 2023-08-24: 4 mg via INTRAVENOUS
  Filled 2023-08-24: qty 2

## 2023-08-24 MED ORDER — ENOXAPARIN SODIUM 40 MG/0.4ML IJ SOSY
40.0000 mg | PREFILLED_SYRINGE | INTRAMUSCULAR | Status: DC
  Administered 2023-08-24: 40 mg via SUBCUTANEOUS
  Filled 2023-08-24: qty 0.4

## 2023-08-24 MED ORDER — TAMSULOSIN HCL 0.4 MG PO CAPS: 0.4000 mg | ORAL_CAPSULE | Freq: Every day | ORAL | Status: DC

## 2023-08-24 MED ORDER — SODIUM CHLORIDE 0.9 % IV SOLN: 2.0000 g | INTRAVENOUS | Status: DC

## 2023-08-24 MED ORDER — CEFAZOLIN SODIUM-DEXTROSE 1-4 GM/50ML-% IV SOLN
1.0000 g | Freq: Three times a day (TID) | INTRAVENOUS | Status: DC
  Administered 2023-08-24 – 2023-08-25 (×2): 1 g via INTRAVENOUS
  Filled 2023-08-24 (×2): qty 50

## 2023-08-24 MED ORDER — ACETAMINOPHEN 325 MG PO TABS
650.0000 mg | ORAL_TABLET | Freq: Four times a day (QID) | ORAL | Status: DC | PRN
  Administered 2023-08-24 – 2023-08-25 (×2): 650 mg via ORAL
  Filled 2023-08-24 (×2): qty 2

## 2023-08-24 MED ORDER — ONDANSETRON 4 MG PO TBDP: 8.0000 mg | ORAL_TABLET | Freq: Three times a day (TID) | ORAL | Status: DC | PRN

## 2023-08-24 MED ORDER — PROCHLORPERAZINE EDISYLATE 10 MG/2ML IJ SOLN: 5.0000 mg | Freq: Four times a day (QID) | INTRAMUSCULAR | Status: DC | PRN

## 2023-08-24 NOTE — ED Provider Notes (Signed)
[facility] Provider Note   CSN: [id number] Arrival date & time: 08/23/23  1657     History  Chief Complaint  Patient presents with   Fever   Emesis    [name] is a 58 y.o. female.  The history is provided by the patient.  Fever Associated symptoms: vomiting   Emesis Associated symptoms: fever   She was seen in emergency department yesterday for vomiting and fever, continues to have vomiting and fever today.  She has been unable to hold anything down.  She has noted that her urine is very foul-smelling but she denies urinary urgency or frequency or tenesmus or dysuria.  There has been marked markedly decreased urine output because of not being able to eat and vomiting.  Maximum temperature at home was 105.8.   Home Medications Prior to Admission medications   Medication Sig Start Date End Date Taking? Authorizing Provider  cephALEXin (KEFLEX) 500 MG capsule Take 1 capsule (500 mg total) by mouth 4 (four) times daily. 06/20/23   Al Decant, PA-C  cyclobenzaprine (FLEXERIL) 5 MG tablet Take 2 tablets (10 mg total) by mouth 2 (two) times daily as needed for muscle spasms. 06/20/23   Al Decant, PA-C  gabapentin (NEURONTIN) 100 MG capsule Take 1 capsule (100 mg total) by mouth 3 (three) times daily. Patient not taking: Reported on 10/17/2022 04/07/22   Storm Frisk, MD  HYDROcodone-acetaminophen (NORCO/VICODIN) 5-325 MG tablet Take 2 tablets by mouth every 4 (four) hours as needed. 11/20/22   Charlynne Pander, MD  ketorolac (TORADOL) 10 MG tablet Take 1 tablet (10 mg total) by mouth every 6 (six) hours as needed for moderate pain (pain score 4-6). 06/20/23   Al Decant, PA-C  Multiple Vitamin (MULTIVITAMIN WITH MINERALS) TABS tablet Take 1 tablet by mouth daily.    [provider]  naproxen (NAPROSYN) 500 MG tablet TAKE 1 TABLET BY MOUTH TWICE DAILY WITH A MEAL 07/20/23   Hoy Register, MD   ondansetron (ZOFRAN-ODT) 4 MG disintegrating tablet 4mg  ODT q4 hours prn nausea/vomit 11/20/22   Charlynne Pander, MD  tamsulosin (FLOMAX) 0.4 MG CAPS capsule Take 1 capsule (0.4 mg total) by mouth daily. 11/20/22   Charlynne Pander, MD      Allergies    Patient has no known allergies.    Review of Systems   Review of Systems  Constitutional:  Positive for fever.  Gastrointestinal:  Positive for vomiting.  All other systems reviewed and are negative.   Physical Exam Updated Vital Signs BP 98/68 (BP Location: Right Arm)   Pulse 86   Temp 98.4 F (36.9 C) (Oral)   Resp 17   Ht 4\' 11"  (1.499 m)   Wt 91.6 kg   SpO2 97%   BMI 40.80 kg/m  Physical Exam Vitals and nursing note reviewed.   58 year old female, resting comfortably and in no acute distress. Vital signs are normal, but had elevated temperature on arrival. Oxygen saturation is 97%, which is normal. Head is normocephalic and atraumatic. PERRLA, EOMI. Oropharynx is clear. Neck is nontender and supple without adenopathy or JVD. Back is nontender and there is no CVA tenderness. Lungs are clear without rales, wheezes, or rhonchi. Chest is nontender. Heart has regular rate and rhythm without murmur. Abdomen is soft, flat, nontender. Extremities have no cyanosis or edema, full range of motion is present. Skin is warm and dry without rash. Neurologic: Mental status is normal,  cranial nerves are intact, moves all extremities equally.  ED Results / Procedures / Treatments   Labs (all labs ordered are listed, but only abnormal results are displayed) Labs Reviewed  COMPREHENSIVE METABOLIC PANEL - Abnormal; Notable for the following components:      Result Value   Calcium 8.6 (*)    Albumin 3.1 (*)    All other components within normal limits  CBC WITH DIFFERENTIAL/PLATELET - Abnormal; Notable for the following components:   RBC 3.83 (*)    Hemoglobin 11.2 (*)    HCT 33.7 (*)    RDW 15.8 (*)    All other components  within normal limits  URINALYSIS, W/ REFLEX TO CULTURE (INFECTION SUSPECTED) - Abnormal; Notable for the following components:   APPearance HAZY (*)    Hgb urine dipstick SMALL (*)    Ketones, ur 80 (*)    Protein, ur 100 (*)    Nitrite POSITIVE (*)    Leukocytes,Ua MODERATE (*)    Bacteria, UA FEW (*)    All other components within normal limits  RESP PANEL BY RT-PCR (RSV, FLU A&B, COVID)  RVPGX2  CULTURE, BLOOD (ROUTINE X 2)  CULTURE, BLOOD (ROUTINE X 2)  URINE CULTURE  PROTIME-INR  APTT  I-STAT CG4 LACTIC ACID, ED  I-STAT CG4 LACTIC ACID, ED   Radiology CT ABDOMEN PELVIS W CONTRAST Result Date: 08/23/2023 CLINICAL DATA:  Abdominal pain, acute, nonlocalized EXAM: CT ABDOMEN AND PELVIS WITH CONTRAST TECHNIQUE: Multidetector CT imaging of the abdomen and pelvis was performed using the standard protocol following bolus administration of intravenous contrast. RADIATION DOSE REDUCTION: This exam was performed according to the departmental dose-optimization program which includes automated exposure control, adjustment of the mA and/or kV according to patient size and/or use of iterative reconstruction technique. CONTRAST:  75mL OMNIPAQUE IOHEXOL 350 MG/ML SOLN COMPARISON:  CT renal 06/20/2023 FINDINGS: Lower chest: No acute abnormality. Hepatobiliary: No focal liver abnormality. No gallstones, gallbladder wall thickening, or pericholecystic fluid. No biliary dilatation. Pancreas: No focal lesion. Normal pancreatic contour. No surrounding inflammatory changes. No main pancreatic ductal dilatation. Spleen: Normal in size without focal abnormality. Adrenals/Urinary Tract: No adrenal nodule bilaterally. Striated left nephrogram more prominent on the delayed view with associated mild urothelial thickening. The right kidney enhances homogeneously. No hydronephrosis. No hydroureter.  No nephroureterolithiasis. The urinary bladder is unremarkable. Stomach/Bowel: Stomach is within normal limits. No evidence  of bowel wall thickening or dilatation. The appendix is not definitely identified with no inflammatory changes in the right lower quadrant to suggest acute appendicitis. Vascular/Lymphatic: No abdominal aorta or iliac aneurysm. Mild atherosclerotic plaque of the aorta and its branches. No abdominal, pelvic, or inguinal lymphadenopathy. Reproductive: Uterus and bilateral adnexa are unremarkable. Other: No intraperitoneal free fluid. No intraperitoneal free gas. No organized fluid collection. Musculoskeletal: No abdominal wall hernia or abnormality. No suspicious lytic or blastic osseous lesions. No acute displaced fracture. IMPRESSION: 1. Left pyelonephritis. 2.  Aortic Atherosclerosis (ICD10-I70.0). Electronically Signed   By: Tish Frederickson M.D.   On: 08/23/2023 21:57   DG Chest 2 View Result Date: 08/22/2023 CLINICAL DATA:  Cough EXAM: CHEST - 2 VIEW COMPARISON:  07/25/2023 FINDINGS: The heart size and mediastinal contours are within normal limits. Both lungs are clear. The visualized skeletal structures are unremarkable. IMPRESSION: No acute abnormality of the lungs. Electronically Signed   By: Jearld Lesch M.D.   On: 08/22/2023 08:25    Procedures Procedures    Medications Ordered in ED Medications  sodium chloride 0.9 % bolus 1,000  mL (has no administration in time range)  ondansetron (ZOFRAN) injection 4 mg (has no administration in time range)  cefTRIAXone (ROCEPHIN) 2 g in sodium chloride 0.9 % 100 mL IVPB (has no administration in time range)  acetaminophen (TYLENOL) tablet 650 mg (650 mg Oral Given 08/23/23 1831)  cefTRIAXone (ROCEPHIN) 2 g in sodium chloride 0.9 % 100 mL IVPB (0 g Intravenous Stopped 08/24/23 0030)  ondansetron (ZOFRAN) injection 4 mg (4 mg Intravenous Given 08/23/23 1831)  iohexol (OMNIPAQUE) 350 MG/ML injection 75 mL (75 mLs Intravenous Contrast Given 08/23/23 2149)    ED Course/ Medical Decision Making/ A&P                                 Medical Decision  Making Risk Prescription drug management. Decision regarding hospitalization.   Persistent vomiting and fever, concern for possible UTI.  I have reviewed her past records, and note ED visit yesterday at which time chest x-ray was unremarkable, labs unremarkable, urinalysis significant for greater than 50 WBCs but contaminated specimen with 21-50 squamous epithelial cells and culture pending.  I have reviewed her laboratory test from today and my interpretation is negative PCR for influenza and COVID-19 and RSV, normal lactic acid level, normal WBC, stable anemia, unremarkable comprehensive metabolic panel, urinalysis concerning for UTI with greater than 50 WBCs but only few bacteria seen, positive nitrite which was negative yesterday.  CT of abdomen and pelvis shows left-sided pyelonephritis.  I have independently viewed the images, and agree with the radiologist's interpretation.  Given intractable nausea, she will need to be admitted.  I have ordered IV fluids, IV ceftriaxone, IV ondansetron for nausea.  I have discussed the case with Dr. Margo Aye of [facility], who agrees to admit the patient.  Final Clinical Impression(s) / ED Diagnoses Final diagnoses:  Pyelonephritis  Nausea and vomiting, unspecified vomiting type  Normochromic normocytic anemia    Rx / DC Orders ED Discharge Orders     None         Dione Booze, MD 08/24/23 0151

## 2023-08-24 NOTE — H&P (Signed)
History and Physical  [name] QQV:[id number] DOB: [birth year] DOA: 08/23/2023  Referring physician: Dr. Preston Fleeting, EDP  PCP: Storm Frisk, MD  Outpatient Specialists: None Patient coming from: Home  Chief Complaint: Nausea vomiting and fever  HPI: Amy Frey is a 58 y.o. female with medical history significant for obesity, kidney stones, influenza viral infection 2 weeks ago, who initially presented to the ER on 08/22/2023 with complaints of sudden onset shaking chills, subjective fevers as high as 105 F.  Associated with increased urinary frequency.  She returns today with complaints of intractable nausea, vomiting, and recurrent fevers.  Associated with flank pain.  Urine culture obtained on 08/22/2023 returned positive for E. coli with susceptibilities to follow.  In the ER, unable to tolerate oral intake with intractable nausea and vomiting.  Repeat UA today was positive for pyuria.  Additionally complained of acute abdominal pain nonlocalized for which a CT abdomen pelvis with contrast was obtained.  It revealed findings suggestive of left pyelonephritis with no hydronephrosis.  Urine culture and peripheral blood cultures x 2 were obtained.  The patient was started on empiric IV antibiotics Rocephin.  Admitted by [facility], hospitalist service.  ED Course: Temperature 38.2.  BP 170/82, pulse 90, respiratory 16, saturation 100% on room air.  Review of Systems: Review of systems as noted in the HPI. All other systems reviewed and are negative.   Past Medical History:  Diagnosis Date   Arthritis    Depression    Migraine    Vertigo    Past Surgical History:  Procedure Laterality Date   APPENDECTOMY      Social History:  reports that she has never smoked. She has never used smokeless tobacco. She reports that she does not currently use alcohol after a past usage of about 1.0 - 2.0 standard drink of alcohol per week. She reports that she does not currently use drugs after  having used the following drugs: Marijuana.   No Known Allergies  Family History  Problem Relation Age of Onset   Healthy Mother    Heart failure Father       Prior to Admission medications   Medication Sig Start Date End Date Taking? Authorizing Provider  cyclobenzaprine (FLEXERIL) 5 MG tablet Take 2 tablets (10 mg total) by mouth 2 (two) times daily as needed for muscle spasms. 06/20/23   Al Decant, PA-C  gabapentin (NEURONTIN) 100 MG capsule Take 1 capsule (100 mg total) by mouth 3 (three) times daily. Patient not taking: Reported on 10/17/2022 04/07/22   Storm Frisk, MD  HYDROcodone-acetaminophen (NORCO/VICODIN) 5-325 MG tablet Take 2 tablets by mouth every 4 (four) hours as needed. 11/20/22   Charlynne Pander, MD  ketorolac (TORADOL) 10 MG tablet Take 1 tablet (10 mg total) by mouth every 6 (six) hours as needed for moderate pain (pain score 4-6). 06/20/23   Al Decant, PA-C  Multiple Vitamin (MULTIVITAMIN WITH MINERALS) TABS tablet Take 1 tablet by mouth daily.    [provider]  naproxen (NAPROSYN) 500 MG tablet TAKE 1 TABLET BY MOUTH TWICE DAILY WITH A MEAL 07/20/23   Hoy Register, MD  ondansetron (ZOFRAN-ODT) 4 MG disintegrating tablet 4mg  ODT q4 hours prn nausea/vomit 11/20/22   Charlynne Pander, MD  tamsulosin (FLOMAX) 0.4 MG CAPS capsule Take 1 capsule (0.4 mg total) by mouth daily. 11/20/22   Charlynne Pander, MD    Physical Exam: BP 117/82 (BP Location: Right Arm)   Pulse 90   Temp 98.2  F (36.8 C) (Oral)   Resp 16   Ht 4\' 11"  (1.499 m)   Wt 91.6 kg   SpO2 100%   BMI 40.80 kg/m   General: 58 y.o. year-old female well developed well nourished in no acute distress.  Alert and oriented x3. Cardiovascular: Regular rate and rhythm with no rubs or gallops.  No thyromegaly or JVD noted.  No lower extremity edema. 2/4 pulses in all 4 extremities. Respiratory: Clear to auscultation with no wheezes or rales. Good inspiratory  effort. Abdomen: Soft, left flank tenderness, nondistended with normal bowel sounds x4 quadrants. Muskuloskeletal: No cyanosis, clubbing or edema noted bilaterally Neuro: CN II-XII intact, strength, sensation, reflexes Skin: No ulcerative lesions noted or rashes Psychiatry: Judgement and insight appear normal. Mood is appropriate for condition and setting          Labs on Admission:  Basic Metabolic Panel: Recent Labs  Lab 08/22/23 0716 08/23/23 1854  NA 137 136  K 3.5 3.9  CL 102 100  CO2 24 22  GLUCOSE 122* 94  BUN 9 8  CREATININE 0.81 0.76  CALCIUM 9.2 8.6*   Liver Function Tests: Recent Labs  Lab 08/23/23 1854  AST 25  ALT 16  ALKPHOS 51  BILITOT 0.9  PROT 7.0  ALBUMIN 3.1*   No results for input(s): "LIPASE", "AMYLASE" in the last 168 hours. No results for input(s): "AMMONIA" in the last 168 hours. CBC: Recent Labs  Lab 08/22/23 0716 08/23/23 1854  WBC 8.1 7.8  NEUTROABS 6.2 5.6  HGB 11.7* 11.2*  HCT 36.0 33.7*  MCV 89.6 88.0  PLT 173 172   Cardiac Enzymes: No results for input(s): "CKTOTAL", "CKMB", "CKMBINDEX", "TROPONINI" in the last 168 hours.  BNP (last 3 results) No results for input(s): "BNP" in the last 8760 hours.  ProBNP (last 3 results) No results for input(s): "PROBNP" in the last 8760 hours.  CBG: No results for input(s): "GLUCAP" in the last 168 hours.  Radiological Exams on Admission: CT ABDOMEN PELVIS W CONTRAST Result Date: 08/23/2023 CLINICAL DATA:  Abdominal pain, acute, nonlocalized EXAM: CT ABDOMEN AND PELVIS WITH CONTRAST TECHNIQUE: Multidetector CT imaging of the abdomen and pelvis was performed using the standard protocol following bolus administration of intravenous contrast. RADIATION DOSE REDUCTION: This exam was performed according to the departmental dose-optimization program which includes automated exposure control, adjustment of the mA and/or kV according to patient size and/or use of iterative reconstruction  technique. CONTRAST:  75mL OMNIPAQUE IOHEXOL 350 MG/ML SOLN COMPARISON:  CT renal 06/20/2023 FINDINGS: Lower chest: No acute abnormality. Hepatobiliary: No focal liver abnormality. No gallstones, gallbladder wall thickening, or pericholecystic fluid. No biliary dilatation. Pancreas: No focal lesion. Normal pancreatic contour. No surrounding inflammatory changes. No main pancreatic ductal dilatation. Spleen: Normal in size without focal abnormality. Adrenals/Urinary Tract: No adrenal nodule bilaterally. Striated left nephrogram more prominent on the delayed view with associated mild urothelial thickening. The right kidney enhances homogeneously. No hydronephrosis. No hydroureter.  No nephroureterolithiasis. The urinary bladder is unremarkable. Stomach/Bowel: Stomach is within normal limits. No evidence of bowel wall thickening or dilatation. The appendix is not definitely identified with no inflammatory changes in the right lower quadrant to suggest acute appendicitis. Vascular/Lymphatic: No abdominal aorta or iliac aneurysm. Mild atherosclerotic plaque of the aorta and its branches. No abdominal, pelvic, or inguinal lymphadenopathy. Reproductive: Uterus and bilateral adnexa are unremarkable. Other: No intraperitoneal free fluid. No intraperitoneal free gas. No organized fluid collection. Musculoskeletal: No abdominal wall hernia or abnormality. No suspicious lytic or  blastic osseous lesions. No acute displaced fracture. IMPRESSION: 1. Left pyelonephritis. 2.  Aortic Atherosclerosis (ICD10-I70.0). Electronically Signed   By: Tish Frederickson M.D.   On: 08/23/2023 21:57   DG Chest 2 View Result Date: 08/22/2023 CLINICAL DATA:  Cough EXAM: CHEST - 2 VIEW COMPARISON:  07/25/2023 FINDINGS: The heart size and mediastinal contours are within normal limits. Both lungs are clear. The visualized skeletal structures are unremarkable. IMPRESSION: No acute abnormality of the lungs. Electronically Signed   By: Jearld Lesch  M.D.   On: 08/22/2023 08:25    EKG: I independently viewed the EKG done and my findings are as followed: None available at the time of this visit.  Assessment/Plan Present on Admission:  Pyelonephritis  Principal Problem:   Pyelonephritis  Left pyonephritis, seen on CT scan, POA UA positive for pyuria Urine culture from 08/22/2023 positive for E. coli, follow sensitivities and narrow down antibiotics as able. Follow urine culture and peripheral blood cultures x 2 obtained on 08/23/2023. Pain control with bowel regimen  Intractable nausea and vomiting in the setting of the above Continue supportive care IV antiemetics as needed Gentle IV fluid hydration NS at 50 cc/h x 1 day  History of nephrolithiasis No longer on home Flomax Continue gentle IV fluid hydration  Obesity BMI 40 Recommend weight loss outpatient with regular physical activity and healthy dieting.   Time: 75 minutes.   DVT prophylaxis: Subcu Lovenox daily.  Code Status: Full code  Family Communication: None at bedside.  Disposition Plan: Admitted to MedSurg unit  Consults called: None.  Admission status: Inpatient status.   Status is: Inpatient The patient requires at least 2 midnights for further evaluation and treatment of present condition.   Darlin Drop MD [facility] Pager [phone]  If 7PM-7AM, please contact night-coverage [email or web address] Password [email or web address]  08/24/2023, 1:23 AM

## 2023-08-24 NOTE — Progress Notes (Signed)
Interim progress note not for billing.  I have seen and examined the patient, reviewed the chart, and agree with assessment and plan documented by my colleague who admitted the patient earlier this morning, unless otherwise stipulated.  Here with fever, nausea/vomiting. Presented this way 2 days ago, urine culture taken then is now positive for e coli, pan-sensitive. CT here shows uncomplicated left-sided pyelo. Received ceftriaxone last night. Reports interval improvement in symptoms. Covid/flu/rsv neg. Blood cultures obtained, ngtd. Will continue IVF, narrow abx to cefazolin. If feeling well tomorrow would consider discharge then on oral antibiotics.

## 2023-08-25 ENCOUNTER — Inpatient Hospital Stay: Payer: Commercial Managed Care - HMO | Admitting: Internal Medicine

## 2023-08-25 DIAGNOSIS — N10 Acute tubulo-interstitial nephritis: Secondary | ICD-10-CM | POA: Diagnosis not present

## 2023-08-25 DIAGNOSIS — N12 Tubulo-interstitial nephritis, not specified as acute or chronic: Secondary | ICD-10-CM | POA: Diagnosis not present

## 2023-08-25 LAB — BASIC METABOLIC PANEL
Anion gap: 8 (ref 5–15)
BUN: 11 mg/dL (ref 6–20)
CO2: 24 mmol/L (ref 22–32)
Calcium: 8.1 mg/dL — ABNORMAL LOW (ref 8.9–10.3)
Chloride: 104 mmol/L (ref 98–111)
Creatinine, Ser: 0.65 mg/dL (ref 0.44–1.00)
GFR, Estimated: >60 mL/min (ref >60)
Glucose, Bld: 99 mg/dL (ref 70–99)
Potassium: 3.8 mmol/L (ref 3.5–5.1)
Sodium: 136 mmol/L (ref 135–145)

## 2023-08-25 LAB — CBC
HCT: 30 % — ABNORMAL LOW (ref 36.0–46.0)
Hemoglobin: 10 g/dL — ABNORMAL LOW (ref 12.0–15.0)
MCH: 29.6 pg (ref 26.0–34.0)
MCHC: 33.3 g/dL (ref 30.0–36.0)
MCV: 88.8 fL (ref 80.0–100.0)
Platelets: 166 10*3/uL (ref 150–400)
RBC: 3.38 MIL/uL — ABNORMAL LOW (ref 3.87–5.11)
RDW: 15.9 % — ABNORMAL HIGH (ref 11.5–15.5)
WBC: 4.8 10*3/uL (ref 4.0–10.5)
nRBC: 0 % (ref 0.0–0.2)

## 2023-08-25 MED ORDER — TRAMADOL HCL 50 MG PO TABS: 50.0000 mg | ORAL_TABLET | Freq: Four times a day (QID) | ORAL | 0 refills | Status: AC | PRN

## 2023-08-25 MED ORDER — CIPROFLOXACIN HCL 500 MG PO TABS: 500.0000 mg | ORAL_TABLET | Freq: Two times a day (BID) | ORAL | 0 refills | Status: AC

## 2023-08-25 NOTE — Plan of Care (Signed)

## 2023-08-25 NOTE — Discharge Summary (Signed)
Physician Discharge Summary  Amy Frey BMW:[id number] DOB: [birth year] DOA: 08/23/2023  PCP: Storm Frisk, MD  Admit date: 08/23/2023 Discharge date: 08/25/2023  Admitted From: Home Disposition: Home  Recommendations for Outpatient Follow-up:  Follow up with PCP in 1-2 weeks Continue ciprofloxacin to complete total course of 14 days for acute pyelonephritis, E. coli urinary tract infection  Home Health: No Equipment/Devices: None   Discharge Condition: Stable CODE STATUS: Full code Diet recommendation: Regular Diet  History of present illness:  Amy Frey is a 58 year old female with past medical history significant for obesity, history of nephrolithiasis, recent influenza infection 2 weeks prior who presented to [facility] ED on 1/26 with intractable nausea/vomiting, recurrent fever, abdominal pain, back pain.  Recently seen in the ED day prior for shaking chills, fever up to 105.0 F.  Also endorsed urinary frequency.  Urinalysis was performed and patient was discharged home.  Urinalysis returned back positive for E. coli and patient was instructed to return to the ED.  In the ED, temperature 102.8 F, HR 105, RR 22, BP 128/88, SpO2 98% on 2 L nasal cannula.  WBC 7.8, hemoglobin 11.2, platelet count 172.  Sodium 136, potassium 3.9, chloride 100, CO2 22, glucose 94, BUN 8, creatinine 0.76.  AST 25, ALT 16, total bilirubin 0.9.  Lactic acid 0.5.  COVID/RSV/influenza PCR negative.  Urinalysis with moderate leukocytes, positive nitrite, few bacteria, greater than 50 WBCs.  CT abdomen/pelvis with contrast notable for left pyelonephritis.  Patient was started empiric antibiotics for sepsis secondary to UTI/pyelonephritis.  TRH consulted for admission for further evaluation and management.  Hospital course:  Sepsis, POA E. coli urinary tract infection Left pyelonephritis Patient represented to ED with continued abdominal pain, back pain associated with intractable  nausea and vomiting in the setting of persistent fevers.  Temperature 102.8 F on admission.  Patient was tachycardic, tachypneic.  Urinalysis consistent with UTI and CT abdomen/pelvis notable for left pyelonephritis.  The patient was started on Parikh and a biotics.  Patient now been afebrile for greater than 24 hours.  Appetite improved without any further nausea and vomiting.  Urine culture 1/25 positive for E. coli with pan sensitivity.  Will discharge on Cipro 500 mg p.o. twice daily to complete 14-day total antibiotic course.  Outpatient follow-up with PCP 1-2 weeks.  Obesity, class II Body mass index is 40.8 kg/m.  Complicates all facets of care.  Discharge Diagnoses:  Principal Problem:   Pyelonephritis Active Problems:   Obesity (BMI 30-39.9)    Discharge Instructions  Discharge Instructions     Call MD for:  difficulty breathing, headache or visual disturbances   Complete by: As directed    Call MD for:  extreme fatigue   Complete by: As directed    Call MD for:  persistant dizziness or light-headedness   Complete by: As directed    Call MD for:  persistant nausea and vomiting   Complete by: As directed    Call MD for:  severe uncontrolled pain   Complete by: As directed    Call MD for:  temperature >100.4   Complete by: As directed    Diet - low sodium heart healthy   Complete by: As directed    Increase activity slowly   Complete by: As directed       Allergies as of 08/25/2023   No Known Allergies      Medication List     STOP taking these medications    cephALEXin 500 MG capsule  Commonly known as: KEFLEX       TAKE these medications    Acetaminophen 8 Hour 650 MG CR tablet Generic drug: acetaminophen Take 650 mg by mouth every 8 (eight) hours as needed for pain or fever.   ascorbic acid 500 MG tablet Commonly known as: VITAMIN C Take 500 mg by mouth daily.   Biotin 1 MG Caps Take 1 capsule by mouth daily.   ciprofloxacin 500 MG  tablet Commonly known as: Cipro Take 1 tablet (500 mg total) by mouth 2 (two) times daily for 13 days.   co-enzyme Q-10 30 MG capsule Take 30 mg by mouth daily.   ibuprofen 200 MG tablet Commonly known as: ADVIL Take 400 mg by mouth every 6 (six) hours as needed for fever or mild pain (pain score 1-3).   multivitamin with minerals Tabs tablet Take 1 tablet by mouth daily.   naproxen 500 MG tablet Commonly known as: NAPROSYN TAKE 1 TABLET BY MOUTH TWICE DAILY WITH A MEAL What changed:  when to take this reasons to take this   omega-3 fish oil 1000 MG Caps capsule Commonly known as: MAXEPA Take 1 capsule by mouth daily.   traMADol 50 MG tablet Commonly known as: Ultram Take 1 tablet (50 mg total) by mouth every 6 (six) hours as needed for up to 7 days.        Follow-up Information     Storm Frisk, MD. Schedule an appointment as soon as possible for a visit in 1 week(s).   Specialty: Pulmonary Disease Contact information: [address] [place] [address] [phone]                No Known Allergies  Consultations: None   Procedures/Studies: CT ABDOMEN PELVIS W CONTRAST Result Date: 08/23/2023 CLINICAL DATA:  Abdominal pain, acute, nonlocalized EXAM: CT ABDOMEN AND PELVIS WITH CONTRAST TECHNIQUE: Multidetector CT imaging of the abdomen and pelvis was performed using the standard protocol following bolus administration of intravenous contrast. RADIATION DOSE REDUCTION: This exam was performed according to the departmental dose-optimization program which includes automated exposure control, adjustment of the mA and/or kV according to patient size and/or use of iterative reconstruction technique. CONTRAST:  75mL OMNIPAQUE IOHEXOL 350 MG/ML SOLN COMPARISON:  CT renal 06/20/2023 FINDINGS: Lower chest: No acute abnormality. Hepatobiliary: No focal liver abnormality. No gallstones, gallbladder wall thickening, or pericholecystic fluid. No biliary  dilatation. Pancreas: No focal lesion. Normal pancreatic contour. No surrounding inflammatory changes. No main pancreatic ductal dilatation. Spleen: Normal in size without focal abnormality. Adrenals/Urinary Tract: No adrenal nodule bilaterally. Striated left nephrogram more prominent on the delayed view with associated mild urothelial thickening. The right kidney enhances homogeneously. No hydronephrosis. No hydroureter.  No nephroureterolithiasis. The urinary bladder is unremarkable. Stomach/Bowel: Stomach is within normal limits. No evidence of bowel wall thickening or dilatation. The appendix is not definitely identified with no inflammatory changes in the right lower quadrant to suggest acute appendicitis. Vascular/Lymphatic: No abdominal aorta or iliac aneurysm. Mild atherosclerotic plaque of the aorta and its branches. No abdominal, pelvic, or inguinal lymphadenopathy. Reproductive: Uterus and bilateral adnexa are unremarkable. Other: No intraperitoneal free fluid. No intraperitoneal free gas. No organized fluid collection. Musculoskeletal: No abdominal wall hernia or abnormality. No suspicious lytic or blastic osseous lesions. No acute displaced fracture. IMPRESSION: 1. Left pyelonephritis. 2.  Aortic Atherosclerosis (ICD10-I70.0). Electronically Signed   By: Tish Frederickson M.D.   On: 08/23/2023 21:57   DG Chest 2 View Result Date: 08/22/2023  CLINICAL DATA:  Cough EXAM: CHEST - 2 VIEW COMPARISON:  07/25/2023 FINDINGS: The heart size and mediastinal contours are within normal limits. Both lungs are clear. The visualized skeletal structures are unremarkable. IMPRESSION: No acute abnormality of the lungs. Electronically Signed   By: Jearld Lesch M.D.   On: 08/22/2023 08:25     Subjective: Patient seen examined bedside, resting calmly.  Sitting in bedside chair.  Family present.  Appetite improved, no further nausea/vomiting.  Has been afebrile.  Ready for discharge home.  No other specific questions,  concerns or complaints at this time.  Denies headache, no dizziness, no chest pain, no palpitations, no shortness of breath, no current abdominal pain, no fever/chills/night sweats, no nausea/vomiting/diarrhea, no focal weakness, no fatigue, no paresthesias.  No acute events overnight per nursing.  Discharge Exam: Vitals:   08/25/23 0538 08/25/23 0830  BP: 98/64 100/66  Pulse: 75 74  Resp: 18 18  Temp: 98.6 F (37 C) 98.2 F (36.8 C)  SpO2: 97% 97%   Vitals:   08/24/23 1515 08/24/23 1940 08/25/23 0538 08/25/23 0830  BP: 110/79 90/64 98/64  100/66  Pulse: 78 79 75 74  Resp: 14 18 18 18   Temp: 98.2 F (36.8 C) 98.7 F (37.1 C) 98.6 F (37 C) 98.2 F (36.8 C)  TempSrc: Oral Oral    SpO2: 100% 99% 97% 97%  Weight:      Height:        Physical Exam: GEN: NAD, alert and oriented x 3, obese HEENT: NCAT, PERRL, EOMI, sclera clear, MMM PULM: CTAB w/o wheezes/crackles, normal respiratory effort, on room air CV: RRR w/o M/G/R GI: abd soft, NTND, NABS, no R/G/M MSK: no peripheral edema, muscle strength globally intact 5/5 bilateral upper/lower extremities NEURO: CN II-XII intact, no focal deficits, sensation to light touch intact PSYCH: normal mood/affect Integumentary: dry/intact, no rashes or wounds    The results of significant diagnostics from this hospitalization (including imaging, microbiology, ancillary and laboratory) are listed below for reference.     Microbiology: Recent Results (from the past 240 hours)  Resp panel by RT-PCR (RSV, Flu A&B, Covid) Anterior Nasal Swab     Status: None   Collection Time: 08/22/23  7:06 AM   Specimen: Anterior Nasal Swab  Result Value Ref Range Status   SARS Coronavirus 2 by RT PCR NEGATIVE NEGATIVE Final   Influenza A by PCR NEGATIVE NEGATIVE Final   Influenza B by PCR NEGATIVE NEGATIVE Final    Comment: (NOTE) The Xpert Xpress SARS-CoV-2/FLU/RSV plus assay is intended as an aid in the diagnosis of influenza from Nasopharyngeal  swab specimens and should not be used as a sole basis for treatment. Nasal washings and aspirates are unacceptable for Xpert Xpress SARS-CoV-2/FLU/RSV testing.  Fact Sheet for Patients: BloggerCourse.com  Fact Sheet for Healthcare Providers: SeriousBroker.it  This test is not yet approved or cleared by the [place] FDA and has been authorized for detection and/or diagnosis of SARS-CoV-2 by FDA under an Emergency Use Authorization (EUA). This EUA will remain in effect (meaning this test can be used) for the duration of the COVID-19 declaration under Section 564(b)(1) of the Act, 21 U.S.C. section 360bbb-3(b)(1), unless the authorization is terminated or revoked.     Resp Syncytial Virus by PCR NEGATIVE NEGATIVE Final    Comment: (NOTE) Fact Sheet for Patients: BloggerCourse.com  Fact Sheet for Healthcare Providers: SeriousBroker.it  This test is not yet approved or cleared by the [place] FDA and has been authorized for detection and/or  diagnosis of SARS-CoV-2 by FDA under an Emergency Use Authorization (EUA). This EUA will remain in effect (meaning this test can be used) for the duration of the COVID-19 declaration under Section 564(b)(1) of the Act, 21 U.S.C. section 360bbb-3(b)(1), unless the authorization is terminated or revoked.  Performed at [facility], [address] [place], [place] [address]   Urine Culture     Status: Abnormal   Collection Time: 08/22/23  8:31 AM   Specimen: Urine, Clean Catch  Result Value Ref Range Status   Specimen Description URINE, CLEAN CATCH  Final   Special Requests   Final    Normal Performed at [facility], [address], [place], [place] [address]    Culture >=100,000 COLONIES/mL ESCHERICHIA COLI (A)  Final   Report Status 08/24/2023 FINAL  Final   Organism ID, Bacteria ESCHERICHIA COLI (A)  Final       Susceptibility   Escherichia coli - MIC*    AMPICILLIN <=2 SENSITIVE Sensitive     CEFAZOLIN <=4 SENSITIVE Sensitive     CEFEPIME <=0.12 SENSITIVE Sensitive     CEFTRIAXONE <=0.25 SENSITIVE Sensitive     CIPROFLOXACIN <=0.25 SENSITIVE Sensitive     GENTAMICIN <=1 SENSITIVE Sensitive     IMIPENEM <=0.25 SENSITIVE Sensitive     NITROFURANTOIN <=16 SENSITIVE Sensitive     TRIMETH/SULFA <=20 SENSITIVE Sensitive     AMPICILLIN/SULBACTAM <=2 SENSITIVE Sensitive     PIP/TAZO <=4 SENSITIVE Sensitive ug/mL    * >=100,000 COLONIES/mL ESCHERICHIA COLI  Blood Culture (routine x 2)     Status: None (Preliminary result)   Collection Time: 08/23/23  6:20 PM   Specimen: BLOOD  Result Value Ref Range Status   Specimen Description BLOOD RIGHT ANTECUBITAL  Final   Special Requests   Final    BOTTLES DRAWN AEROBIC AND ANAEROBIC Blood Culture results may not be optimal due to an inadequate volume of blood received in culture bottles   Culture   Final    NO GROWTH 2 DAYS Performed at [facility], [address], [place], [place] [address]    Report Status PENDING  Incomplete  Blood Culture (routine x 2)     Status: None (Preliminary result)   Collection Time: 08/23/23  6:25 PM   Specimen: BLOOD  Result Value Ref Range Status   Specimen Description BLOOD LEFT ANTECUBITAL  Final   Special Requests   Final    BOTTLES DRAWN AEROBIC AND ANAEROBIC Blood Culture results may not be optimal due to an inadequate volume of blood received in culture bottles   Culture   Final    NO GROWTH 2 DAYS Performed at [facility], [address], [place], [place] [address]    Report Status PENDING  Incomplete  Urine Culture     Status: None   Collection Time: 08/23/23  6:52 PM   Specimen: Urine, Random  Result Value Ref Range Status   Specimen Description URINE, RANDOM  Final   Special Requests NONE Reflexed from [id number]  Final   Culture   Final    NO GROWTH Performed at [facility], [address] [place] [address], [place], [place] [address]    Report Status 08/24/2023 FINAL  Final  Resp panel by RT-PCR (RSV, Flu A&B, Covid) Anterior Nasal Swab     Status: None   Collection Time: 08/23/23  7:03 PM   Specimen: Anterior Nasal Swab  Result Value Ref Range Status   SARS Coronavirus  2 by RT PCR NEGATIVE NEGATIVE Final   Influenza A by PCR NEGATIVE NEGATIVE Final   Influenza B by PCR NEGATIVE NEGATIVE Final    Comment: (NOTE) The Xpert Xpress SARS-CoV-2/FLU/RSV plus assay is intended as an aid in the diagnosis of influenza from Nasopharyngeal swab specimens and should not be used as a sole basis for treatment. Nasal washings and aspirates are unacceptable for Xpert Xpress SARS-CoV-2/FLU/RSV testing.  Fact Sheet for Patients: BloggerCourse.com  Fact Sheet for Healthcare Providers: SeriousBroker.it  This test is not yet approved or cleared by the [place] FDA and has been authorized for detection and/or diagnosis of SARS-CoV-2 by FDA under an Emergency Use Authorization (EUA). This EUA will remain in effect (meaning this test can be used) for the duration of the COVID-19 declaration under Section 564(b)(1) of the Act, 21 U.S.C. section 360bbb-3(b)(1), unless the authorization is terminated or revoked.     Resp Syncytial Virus by PCR NEGATIVE NEGATIVE Final    Comment: (NOTE) Fact Sheet for Patients: BloggerCourse.com  Fact Sheet for Healthcare Providers: SeriousBroker.it  This test is not yet approved or cleared by the [place] FDA and has been authorized for detection and/or diagnosis of SARS-CoV-2 by FDA under an Emergency Use Authorization (EUA). This EUA will remain in effect (meaning this test can be used) for the duration of the COVID-19 declaration under Section 564(b)(1) of the Act, 21 U.S.C. section 360bbb-3(b)(1), unless the authorization is terminated  or revoked.  Performed at [facility], [address] [place], [place] [address]      Labs: BNP (last 3 results) No results for input(s): "BNP" in the last 8760 hours. Basic Metabolic Panel: Recent Labs  Lab 08/22/23 0716 08/23/23 1854 08/24/23 0333 08/25/23 0625  NA 137 136  --  136  K 3.5 3.9  --  3.8  CL 102 100  --  104  CO2 24 22  --  24  GLUCOSE 122* 94  --  99  BUN 9 8  --  11  CREATININE 0.81 0.76 0.69 0.65  CALCIUM 9.2 8.6*  --  8.1*   Liver Function Tests: Recent Labs  Lab 08/23/23 1854  AST 25  ALT 16  ALKPHOS 51  BILITOT 0.9  PROT 7.0  ALBUMIN 3.1*   No results for input(s): "LIPASE", "AMYLASE" in the last 168 hours. No results for input(s): "AMMONIA" in the last 168 hours. CBC: Recent Labs  Lab 08/22/23 0716 08/23/23 1854 08/24/23 0333 08/25/23 0625  WBC 8.1 7.8 7.3 4.8  NEUTROABS 6.2 5.6  --   --   HGB 11.7* 11.2* 11.4* 10.0*  HCT 36.0 33.7* 34.8* 30.0*  MCV 89.6 88.0 89.5 88.8  PLT 173 172 159 166   Cardiac Enzymes: No results for input(s): "CKTOTAL", "CKMB", "CKMBINDEX", "TROPONINI" in the last 168 hours. BNP: Invalid input(s): "POCBNP" CBG: No results for input(s): "GLUCAP" in the last 168 hours. D-Dimer No results for input(s): "DDIMER" in the last 72 hours. Hgb A1c No results for input(s): "HGBA1C" in the last 72 hours. Lipid Profile No results for input(s): "CHOL", "HDL", "LDLCALC", "TRIG", "CHOLHDL", "LDLDIRECT" in the last 72 hours. Thyroid function studies No results for input(s): "TSH", "T4TOTAL", "T3FREE", "THYROIDAB" in the last 72 hours.  Invalid input(s): "FREET3" Anemia work up No results for input(s): "VITAMINB12", "FOLATE", "FERRITIN", "TIBC", "IRON", "RETICCTPCT" in the last 72 hours. Urinalysis    Component Value Date/Time   COLORURINE YELLOW 08/23/2023 1852   APPEARANCEUR HAZY (A) 08/23/2023 1852   LABSPEC  1.016 08/23/2023 1852   PHURINE 5.0 08/23/2023 1852   GLUCOSEU NEGATIVE 08/23/2023 1852    HGBUR SMALL (A) 08/23/2023 1852   BILIRUBINUR NEGATIVE 08/23/2023 1852   KETONESUR 80 (A) 08/23/2023 1852   PROTEINUR 100 (A) 08/23/2023 1852   NITRITE POSITIVE (A) 08/23/2023 1852   LEUKOCYTESUR MODERATE (A) 08/23/2023 1852   Sepsis Labs Recent Labs  Lab 08/22/23 0716 08/23/23 1854 08/24/23 0333 08/25/23 0625  WBC 8.1 7.8 7.3 4.8   Microbiology Recent Results (from the past 240 hours)  Resp panel by RT-PCR (RSV, Flu A&B, Covid) Anterior Nasal Swab     Status: None   Collection Time: 08/22/23  7:06 AM   Specimen: Anterior Nasal Swab  Result Value Ref Range Status   SARS Coronavirus 2 by RT PCR NEGATIVE NEGATIVE Final   Influenza A by PCR NEGATIVE NEGATIVE Final   Influenza B by PCR NEGATIVE NEGATIVE Final    Comment: (NOTE) The Xpert Xpress SARS-CoV-2/FLU/RSV plus assay is intended as an aid in the diagnosis of influenza from Nasopharyngeal swab specimens and should not be used as a sole basis for treatment. Nasal washings and aspirates are unacceptable for Xpert Xpress SARS-CoV-2/FLU/RSV testing.  Fact Sheet for Patients: BloggerCourse.com  Fact Sheet for Healthcare Providers: SeriousBroker.it  This test is not yet approved or cleared by the [place] FDA and has been authorized for detection and/or diagnosis of SARS-CoV-2 by FDA under an Emergency Use Authorization (EUA). This EUA will remain in effect (meaning this test can be used) for the duration of the COVID-19 declaration under Section 564(b)(1) of the Act, 21 U.S.C. section 360bbb-3(b)(1), unless the authorization is terminated or revoked.     Resp Syncytial Virus by PCR NEGATIVE NEGATIVE Final    Comment: (NOTE) Fact Sheet for Patients: BloggerCourse.com  Fact Sheet for Healthcare Providers: SeriousBroker.it  This test is not yet approved or cleared by the [place] FDA and has been authorized for  detection and/or diagnosis of SARS-CoV-2 by FDA under an Emergency Use Authorization (EUA). This EUA will remain in effect (meaning this test can be used) for the duration of the COVID-19 declaration under Section 564(b)(1) of the Act, 21 U.S.C. section 360bbb-3(b)(1), unless the authorization is terminated or revoked.  Performed at [facility], [address], [place], [place] [address]   Urine Culture     Status: Abnormal   Collection Time: 08/22/23  8:31 AM   Specimen: Urine, Clean Catch  Result Value Ref Range Status   Specimen Description URINE, CLEAN CATCH  Final   Special Requests   Final    Normal Performed at [facility], [address], [place], [place] [address]    Culture >=100,000 COLONIES/mL ESCHERICHIA COLI (A)  Final   Report Status 08/24/2023 FINAL  Final   Organism ID, Bacteria ESCHERICHIA COLI (A)  Final      Susceptibility   Escherichia coli - MIC*    AMPICILLIN <=2 SENSITIVE Sensitive     CEFAZOLIN <=4 SENSITIVE Sensitive     CEFEPIME <=0.12 SENSITIVE Sensitive     CEFTRIAXONE <=0.25 SENSITIVE Sensitive     CIPROFLOXACIN <=0.25 SENSITIVE Sensitive     GENTAMICIN <=1 SENSITIVE Sensitive     IMIPENEM <=0.25 SENSITIVE Sensitive     NITROFURANTOIN <=16 SENSITIVE Sensitive     TRIMETH/SULFA <=20 SENSITIVE Sensitive     AMPICILLIN/SULBACTAM <=2 SENSITIVE Sensitive     PIP/TAZO <=4 SENSITIVE Sensitive ug/mL    * >=100,000 COLONIES/mL ESCHERICHIA COLI  Blood Culture (routine  x 2)     Status: None (Preliminary result)   Collection Time: 08/23/23  6:20 PM   Specimen: BLOOD  Result Value Ref Range Status   Specimen Description BLOOD RIGHT ANTECUBITAL  Final   Special Requests   Final    BOTTLES DRAWN AEROBIC AND ANAEROBIC Blood Culture results may not be optimal due to an inadequate volume of blood received in culture bottles   Culture   Final    NO GROWTH 2 DAYS Performed at [facility], [address] [place], [place] [address]     Report Status PENDING  Incomplete  Blood Culture (routine x 2)     Status: None (Preliminary result)   Collection Time: 08/23/23  6:25 PM   Specimen: BLOOD  Result Value Ref Range Status   Specimen Description BLOOD LEFT ANTECUBITAL  Final   Special Requests   Final    BOTTLES DRAWN AEROBIC AND ANAEROBIC Blood Culture results may not be optimal due to an inadequate volume of blood received in culture bottles   Culture   Final    NO GROWTH 2 DAYS Performed at [facility], [address], [place], [place] [address]    Report Status PENDING  Incomplete  Urine Culture     Status: None   Collection Time: 08/23/23  6:52 PM   Specimen: Urine, Random  Result Value Ref Range Status   Specimen Description URINE, RANDOM  Final   Special Requests NONE Reflexed from [id number]  Final   Culture   Final    NO GROWTH Performed at [facility], [address] [place], [place] [address]    Report Status 08/24/2023 FINAL  Final  Resp panel by RT-PCR (RSV, Flu A&B, Covid) Anterior Nasal Swab     Status: None   Collection Time: 08/23/23  7:03 PM   Specimen: Anterior Nasal Swab  Result Value Ref Range Status   SARS Coronavirus 2 by RT PCR NEGATIVE NEGATIVE Final   Influenza A by PCR NEGATIVE NEGATIVE Final   Influenza B by PCR NEGATIVE NEGATIVE Final    Comment: (NOTE) The Xpert Xpress SARS-CoV-2/FLU/RSV plus assay is intended as an aid in the diagnosis of influenza from Nasopharyngeal swab specimens and should not be used as a sole basis for treatment. Nasal washings and aspirates are unacceptable for Xpert Xpress SARS-CoV-2/FLU/RSV testing.  Fact Sheet for Patients: BloggerCourse.com  Fact Sheet for Healthcare Providers: SeriousBroker.it  This test is not yet approved or cleared by the [place] FDA and has been authorized for detection and/or diagnosis of SARS-CoV-2 by FDA under an Emergency Use Authorization (EUA). This EUA  will remain in effect (meaning this test can be used) for the duration of the COVID-19 declaration under Section 564(b)(1) of the Act, 21 U.S.C. section 360bbb-3(b)(1), unless the authorization is terminated or revoked.     Resp Syncytial Virus by PCR NEGATIVE NEGATIVE Final    Comment: (NOTE) Fact Sheet for Patients: BloggerCourse.com  Fact Sheet for Healthcare Providers: SeriousBroker.it  This test is not yet approved or cleared by the [place] FDA and has been authorized for detection and/or diagnosis of SARS-CoV-2 by FDA under an Emergency Use Authorization (EUA). This EUA will remain in effect (meaning this test can be used) for the duration of the COVID-19 declaration under Section 564(b)(1) of the Act, 21 U.S.C. section 360bbb-3(b)(1), unless the authorization is terminated or revoked.  Performed at [facility], [address], [place], [place] [address]  Time coordinating discharge: Over 30 minutes  SIGNED:   Alvira Philips [place], DO  [facility] 08/25/2023, 11:42 AM

## 2023-08-25 NOTE — Plan of Care (Signed)

## 2023-08-25 NOTE — Progress Notes (Signed)
This nurse went over discharge packet with patient. New medications discussed. Patient has no questions at this time. Patient is currently eating lunch, she will finish, get dressed, and her husband will be taking her home, he is currently at bedside.

## 2023-08-26 ENCOUNTER — Telehealth: Payer: Self-pay

## 2023-08-26 NOTE — Transitions of Care (Post Inpatient/ED Visit) (Signed)
   08/26/2023  Name: Amy Frey MRN: [id number] DOB: [birth year]  Today's TOC FU Call Status: Today's TOC FU Call Status:: Successful TOC FU Call Completed TOC FU Call Complete Date: 08/26/23 Patient's Name and Date of Birth confirmed.  Transition Care Management Follow-up Telephone Call Date of Discharge: 08/25/23 Discharge Facility: [facility]) Type of Discharge: Inpatient Admission Primary Inpatient Discharge Diagnosis:: pyelonephritis How have you been since you were released from the hospital?: Better Any questions or concerns?: No  Items Reviewed: Did you receive and understand the discharge instructions provided?: Yes Medications obtained,verified, and reconciled?: Partial Review Completed Reason for Partial Mediation Review: She said she has all medications and did not have any questions about the med regime and did not need to review the med list. Any new allergies since your discharge?: No Dietary orders reviewed?: Yes Type of Diet Ordered:: heart healthy, low sodium Do you have support at home?: Yes People in Home: spouse Name of Support/Comfort Primary Source: her husband  Medications Reviewed Today: Medications Reviewed Today   Medications were not reviewed in this encounter     Home Care and Equipment/Supplies: Were Home Health Services Ordered?: No Any new equipment or medical supplies ordered?: No  Functional Questionnaire: Do you need assistance with bathing/showering or dressing?: No Do you need assistance with meal preparation?: No Do you need assistance with eating?: No Do you have difficulty maintaining continence: No Do you need assistance with getting out of bed/getting out of a chair/moving?: No Do you have difficulty managing or taking your medications?: No  Follow up appointments reviewed: PCP Follow-up appointment confirmed?: Yes Date of PCP follow-up appointment?: 09/09/23 Follow-up Provider: Corene Cornea, [facility]  Follow-up appointment confirmed?: NA Do you need transportation to your follow-up appointment?: No Do you understand care options if your condition(s) worsen?: Yes-patient verbalized understanding    SIGNATURE Robyne Peers, RN

## 2023-08-28 LAB — CULTURE, BLOOD (ROUTINE X 2)
Culture: NO GROWTH
Culture: NO GROWTH

## 2023-09-04 ENCOUNTER — Telehealth: Payer: Self-pay | Admitting: Critical Care Medicine

## 2023-09-04 NOTE — Telephone Encounter (Signed)
 Contacted pt to confirm appt

## 2023-09-09 ENCOUNTER — Ambulatory Visit: Payer: Commercial Managed Care - HMO | Attending: Physician Assistant | Admitting: Physician Assistant

## 2023-09-09 ENCOUNTER — Encounter: Payer: Self-pay | Admitting: Physician Assistant

## 2023-09-09 VITALS — BP 116/78 | HR 70 | Wt 194.8 lb

## 2023-09-09 DIAGNOSIS — Z09 Encounter for follow-up examination after completed treatment for conditions other than malignant neoplasm: Secondary | ICD-10-CM

## 2023-09-09 DIAGNOSIS — R739 Hyperglycemia, unspecified: Secondary | ICD-10-CM | POA: Diagnosis not present

## 2023-09-09 DIAGNOSIS — N12 Tubulo-interstitial nephritis, not specified as acute or chronic: Secondary | ICD-10-CM

## 2023-09-09 DIAGNOSIS — D649 Anemia, unspecified: Secondary | ICD-10-CM | POA: Diagnosis not present

## 2023-09-09 DIAGNOSIS — E785 Hyperlipidemia, unspecified: Secondary | ICD-10-CM

## 2023-09-09 LAB — POCT URINALYSIS DIP (CLINITEK)
Bilirubin, UA: NEGATIVE
Blood, UA: NEGATIVE
Glucose, UA: NEGATIVE mg/dL
Ketones, POC UA: NEGATIVE mg/dL
Nitrite, UA: NEGATIVE
POC PROTEIN,UA: NEGATIVE
Spec Grav, UA: >=1.03 — AB (ref 1.010–1.025)
Urobilinogen, UA: 0.2 U/dL
pH, UA: 5.5 (ref 5.0–8.0)

## 2023-09-09 MED ORDER — FLUCONAZOLE 150 MG PO TABS: 150.0000 mg | ORAL_TABLET | Freq: Once | ORAL | 0 refills | Status: AC

## 2023-09-09 NOTE — Progress Notes (Signed)
Patient ID: Amy Frey, female   DOB: [birth year], 58 y.o.   MRN: [id number]   After hospitalization with E coli  1/26-1/28/2025 History of present illness:   Amy Frey is a 58 year old female with past medical history significant for obesity, history of nephrolithiasis, recent influenza infection 2 weeks prior who presented to [facility] ED on 1/26 with intractable nausea/vomiting, recurrent fever, abdominal pain, back pain.  Recently seen in the ED day prior for shaking chills, fever up to 105.0 F.  Also endorsed urinary frequency.  Urinalysis was performed and patient was discharged home.  Urinalysis returned back positive for E. coli and patient was instructed to return to the ED.  In the ED, temperature 102.8 F, HR 105, RR 22, BP 128/88, SpO2 98% on 2 L nasal cannula.  WBC 7.8, hemoglobin 11.2, platelet count 172.  Sodium 136, potassium 3.9, chloride 100, CO2 22, glucose 94, BUN 8, creatinine 0.76.  AST 25, ALT 16, total bilirubin 0.9.  Lactic acid 0.5.  COVID/RSV/influenza PCR negative.  Urinalysis with moderate leukocytes, positive nitrite, few bacteria, greater than 50 WBCs.  CT abdomen/pelvis with contrast notable for left pyelonephritis.  Patient was started empiric antibiotics for sepsis secondary to UTI/pyelonephritis.  TRH consulted for admission for further evaluation and management.   Hospital course:   Sepsis, POA E. coli urinary tract infection Left pyelonephritis Patient represented to ED with continued abdominal pain, back pain associated with intractable nausea and vomiting in the setting of persistent fevers.  Temperature 102.8 F on admission.  Patient was tachycardic, tachypneic.  Urinalysis consistent with UTI and CT abdomen/pelvis notable for left pyelonephritis.  The patient was started on Parikh and a biotics.  Patient now been afebrile for greater than 24 hours.  Appetite improved without any further nausea and vomiting.  Urine culture 1/25 positive for  E. coli with pan sensitivity.  Will discharge on Cipro 500 mg p.o. twice daily to complete 14-day total antibiotic course.  Outpatient follow-up with PCP 1-2 weeks.   Obesity, class II Body mass index is 40.8 kg/m.  Complicates all facets of care.   Discharge Diagnoses:  Principal Problem:   Pyelonephritis Active Problems:   Obesity (BMI 30-39.9)

## 2023-09-09 NOTE — Patient Instructions (Signed)
Dyslipidemia Dyslipidemia is an imbalance of waxy, fat-like substances (lipids) in the blood. The body needs lipids in small amounts. Dyslipidemia often involves a high level of cholesterol or triglycerides, which are types of lipids. Common forms of dyslipidemia include: High levels of LDL cholesterol. LDL is the type of cholesterol that causes fatty deposits (plaques) to build up in the blood vessels that carry blood away from the heart (arteries). Low levels of HDL cholesterol. HDL cholesterol is the type of cholesterol that protects against heart disease. High levels of HDL remove the LDL buildup from arteries. High levels of triglycerides. Triglycerides are a fatty substance in the blood that is linked to a buildup of plaques in the arteries. What are the causes? There are two main types of dyslipidemia: primary and secondary. Primary dyslipidemia is caused by changes (mutations) in genes that are passed down through families (inherited). These mutations cause several types of dyslipidemia. Secondary dyslipidemia may be caused by various risk factors that can lead to the disease, such as lifestyle choices and certain medical conditions. What increases the risk? You are more likely to develop this condition if you are an older man or if you are a woman who has gone through menopause. Other risk factors include: Having a family history of dyslipidemia. Taking certain medicines, including birth control pills, steroids, some diuretics, and beta-blockers. Eating a diet high in saturated fat. Smoking cigarettes or excessive alcohol intake. Having certain medical conditions such as diabetes, polycystic ovary syndrome (PCOS), kidney disease, liver disease, or hypothyroidism. Not exercising regularly. Being overweight or obese with too much belly fat. What are the signs or symptoms? In most cases, dyslipidemia does not usually cause any symptoms. In severe cases, very high lipid levels can  cause: Fatty bumps under the skin (xanthomas). A white or gray ring around the black center (pupil) of the eye. Very high triglyceride levels can cause inflammation of the pancreas (pancreatitis). How is this diagnosed? Your health care provider may diagnose dyslipidemia based on a routine blood test (fasting blood test). Because most people do not have symptoms of the condition, this blood testing (lipid profile) is done on adults age 60 and older and is repeated every 4-6 years. This test checks: Total cholesterol. This measures the total amount of cholesterol in your blood, including LDL cholesterol, HDL cholesterol, and triglycerides. A healthy number is below 200 mg/dL (4.09 mmol/L). LDL cholesterol. The target number for LDL cholesterol is different for each person, depending on individual risk factors. A healthy number is usually below 100 mg/dL (8.11 mmol/L). Ask your health care provider what your LDL cholesterol should be. HDL cholesterol. An HDL level of 60 mg/dL (9.14 mmol/L) or higher is best because it helps to protect against heart disease. A number below 40 mg/dL (7.82 mmol/L) for men or below 50 mg/dL (9.56 mmol/L) for women increases the risk for heart disease. Triglycerides. A healthy triglyceride number is below 150 mg/dL (2.13 mmol/L). If your lipid profile is abnormal, your health care provider may do other blood tests. How is this treated? Treatment depends on the type of dyslipidemia that you have and your other risk factors for heart disease and stroke. Your health care provider will have a target range for your lipid levels based on this information. Treatment for dyslipidemia starts with lifestyle changes, such as diet and exercise. Your health care provider may recommend that you: Get regular exercise. Make changes to your diet. Quit smoking if you smoke. Limit your alcohol intake. If diet  changes and exercise do not help you reach your goals, your health care provider  may also prescribe medicine to lower lipids. The most commonly prescribed type of medicine lowers your LDL cholesterol (statin drug). If you have a high triglyceride level, your provider may prescribe another type of drug (fibrate) or an omega-3 fish oil supplement, or both. Follow these instructions at home: Eating and drinking  Follow instructions from your health care provider or dietitian about eating or drinking restrictions. Eat a healthy diet as told by your health care provider. This can help you reach and maintain a healthy weight, lower your LDL cholesterol, and raise your HDL cholesterol. This may include: Limiting your calories, if you are overweight. Eating more fruits, vegetables, whole grains, fish, and lean meats. Limiting saturated fat, trans fat, and cholesterol. Do not drink alcohol if: Your health care provider tells you not to drink. You are pregnant, may be pregnant, or are planning to become pregnant. If you drink alcohol: Limit how much you have to: 0-1 drink a day for women. 0-2 drinks a day for men. Know how much alcohol is in your drink. In the [place] one drink equals one 12 oz bottle of beer (355 mL), one 5 oz glass of wine (148 mL), or one 1 oz glass of hard liquor (44 mL). Activity Get regular exercise. Start an exercise and strength training program as told by your health care provider. Ask your health care provider what activities are safe for you. Your health care provider may recommend: 30 minutes of aerobic activity 4-6 days a week. Brisk walking is an example of aerobic activity. Strength training 2 days a week. General instructions Do not use any products that contain nicotine or tobacco. These products include cigarettes, chewing tobacco, and vaping devices, such as e-cigarettes. If you need help quitting, ask your health care provider. Take over-the-counter and prescription medicines only as told by your health care provider. This includes  supplements. Keep all follow-up visits. This is important. Contact a health care provider if: You are having trouble sticking to your exercise or diet plan. You are struggling to quit smoking or to control your use of alcohol. Summary Dyslipidemia often involves a high level of cholesterol or triglycerides, which are types of lipids. Treatment depends on the type of dyslipidemia that you have and your other risk factors for heart disease and stroke. Treatment for dyslipidemia starts with lifestyle changes, such as diet and exercise. Your health care provider may prescribe medicine to lower lipids. This information is not intended to replace advice given to you by your health care provider. Make sure you discuss any questions you have with your health care provider. Document Revised: 02/14/2022 Document Reviewed: 09/17/2020 Elsevier Patient Education  2024 [employer].

## 2023-09-10 ENCOUNTER — Encounter: Payer: Self-pay | Admitting: Physician Assistant

## 2023-09-10 LAB — CBC WITH DIFFERENTIAL/PLATELET
Basophils Absolute: 0.1 10*3/uL (ref 0.0–0.2)
Basos: 1 %
EOS (ABSOLUTE): 0.1 10*3/uL (ref 0.0–0.4)
Eos: 1 %
Hematocrit: 37.5 % (ref 34.0–46.6)
Hemoglobin: 12.2 g/dL (ref 11.1–15.9)
Immature Grans (Abs): 0 10*3/uL (ref 0.0–0.1)
Immature Granulocytes: 0 %
Lymphocytes Absolute: 2.4 10*3/uL (ref 0.7–3.1)
Lymphs: 36 %
MCH: 29.2 pg (ref 26.6–33.0)
MCHC: 32.5 g/dL (ref 31.5–35.7)
MCV: 90 fL (ref 79–97)
Monocytes Absolute: 0.5 10*3/uL (ref 0.1–0.9)
Monocytes: 7 %
Neutrophils Absolute: 3.5 10*3/uL (ref 1.4–7.0)
Neutrophils: 55 %
Platelets: 286 10*3/uL (ref 150–450)
RBC: 4.18 x10E6/uL (ref 3.77–5.28)
RDW: 14.8 % (ref 11.7–15.4)
WBC: 6.5 10*3/uL (ref 3.4–10.8)

## 2023-09-10 LAB — COMPREHENSIVE METABOLIC PANEL
ALT: 13 [IU]/L (ref 0–32)
AST: 15 [IU]/L (ref 0–40)
Albumin: 4.3 g/dL (ref 3.8–4.9)
Alkaline Phosphatase: 65 [IU]/L (ref 44–121)
BUN/Creatinine Ratio: 17 (ref 9–23)
BUN: 14 mg/dL (ref 6–24)
Bilirubin Total: 0.2 mg/dL (ref 0.0–1.2)
CO2: 24 mmol/L (ref 20–29)
Calcium: 9.5 mg/dL (ref 8.7–10.2)
Chloride: 103 mmol/L (ref 96–106)
Creatinine, Ser: 0.82 mg/dL (ref 0.57–1.00)
Globulin, Total: 2.8 g/dL (ref 1.5–4.5)
Glucose: 86 mg/dL (ref 70–99)
Potassium: 4.2 mmol/L (ref 3.5–5.2)
Sodium: 142 mmol/L (ref 134–144)
Total Protein: 7.1 g/dL (ref 6.0–8.5)
eGFR: 82 mL/min/{1.73_m2} (ref >59)

## 2023-09-10 LAB — HEMOGLOBIN A1C
Est. average glucose Bld gHb Est-mCnc: 108 mg/dL
Hgb A1c MFr Bld: 5.4 % (ref 4.8–5.6)

## 2023-09-11 ENCOUNTER — Telehealth: Payer: Self-pay

## 2023-09-11 NOTE — Telephone Encounter (Signed)
Pt was called and is aware of results, DOB was confirmed.  ?

## 2023-09-11 NOTE — Telephone Encounter (Signed)
-----   Message from Georgian Co sent at 09/10/2023  3:17 PM EST ----- Your blood count is back to normal!(No anemia!).  A1C is normal.  No diabetes.  Kidneys, liver, electrolyte levels are normal.  Thanks, Georgian Co, PA-C

## 2023-12-07 ENCOUNTER — Encounter: Payer: Self-pay | Admitting: Family Medicine

## 2023-12-07 ENCOUNTER — Ambulatory Visit: Payer: Commercial Managed Care - HMO | Attending: Family Medicine | Admitting: Family Medicine

## 2023-12-07 VITALS — BP 121/77 | HR 87 | Ht 59.0 in | Wt 183.2 lb

## 2023-12-07 DIAGNOSIS — M25511 Pain in right shoulder: Secondary | ICD-10-CM

## 2023-12-07 DIAGNOSIS — J302 Other seasonal allergic rhinitis: Secondary | ICD-10-CM

## 2023-12-07 DIAGNOSIS — G8929 Other chronic pain: Secondary | ICD-10-CM

## 2023-12-07 MED ORDER — FLUTICASONE PROPIONATE 50 MCG/ACT NA SUSP
2.0000 | Freq: Every day | NASAL | 1 refills | Status: AC
Start: 2023-12-07 — End: ?

## 2023-12-07 MED ORDER — MELOXICAM 7.5 MG PO TABS: 7.5000 mg | ORAL_TABLET | Freq: Every day | ORAL | 1 refills | Status: DC

## 2023-12-07 NOTE — Patient Instructions (Signed)
 VISIT SUMMARY:  Today, you were seen for right shoulder pain and allergy symptoms. Your shoulder pain has been present for the past two to three weeks and is sharp and stabbing with certain movements. Your allergy symptoms started a week ago with a dry cough that became productive, along with sneezing and nasal congestion.  YOUR PLAN:  -ALLERGIC RHINITIS WITH COUGH: Allergic rhinitis is an allergic reaction that causes sneezing, nasal congestion, and a cough. You will start using a nasal spray for congestion and continue taking cetirizine for allergy management.  -RIGHT SHOULDER PAIN DUE TO EARLY ARTHRITIS: Early arthritis in the shoulder can cause pain and stiffness. You will take meloxicam once daily to reduce inflammation and will be referred to physical therapy for exercises to improve your shoulder's range of motion.  INSTRUCTIONS:  Please follow up with physical therapy as referred for your shoulder pain. Continue taking your prescribed medications as directed. If your symptoms do not improve or worsen, please schedule a follow-up appointment.

## 2023-12-07 NOTE — Progress Notes (Signed)
 Subjective:  Patient ID: Amy Frey, female    DOB: 1966-04-11  Age: 57 y.o. MRN: [id number]  CC: Medical Management of Chronic Issues (Right shoulder pain/Allergies )     Discussed the use of AI scribe software for clinical note transcription with the patient, who gave verbal consent to proceed.  History of Present Illness Amy Frey is a 58 year old female who presents with right shoulder pain and allergy symptoms.  She experiences sharp, stabbing right shoulder pain for the past two to three weeks. The pain occurs with certain movements but not at rest or during heavy lifting. It is intermittent and not associated with numbness or tingling, except for occasional numbness in her fingers. She is right-handed and does not experience pain at night.  She has allergy symptoms that began a week ago, starting with a dry cough that progressed to a productive cough. She experiences sneezing and nasal congestion without fever or unusual body aches. Advil  Sinus and Congestion was ineffective, but Claritin once daily improved her symptoms until the cough developed.    Past Medical History:  Diagnosis Date   Arthritis    Depression    Migraine    Vertigo     Past Surgical History:  Procedure Laterality Date   APPENDECTOMY      Family History  Problem Relation Age of Onset   Healthy Mother    Heart failure Father     Social History   Socioeconomic History   Marital status: Married    Spouse name: Not on file   Number of children: Not on file   Years of education: Not on file   Highest education level: Not on file  Occupational History   Not on file  Tobacco Use   Smoking status: Never   Smokeless tobacco: Never  Vaping Use   Vaping status: Former  Substance and Sexual Activity   Alcohol use: Not Currently    Alcohol/week: 1.0 - 2.0 standard drink of alcohol    Types: 1 - 2 Glasses of wine per week    Comment: somtimes on stressful days or partys   Drug use:  Not Currently    Types: Marijuana   Sexual activity: Yes  Other Topics Concern   Not on file  Social History Narrative   Not on file   Social Drivers of Health   Financial Resource Strain: Low Risk  (09/09/2023)   Overall Financial Resource Strain (CARDIA)    Difficulty of Paying Living Expenses: Not very hard  Food Insecurity: No Food Insecurity (09/09/2023)   Hunger Vital Sign    Worried About Running Out of Food in the Last Year: Never true    Ran Out of Food in the Last Year: Never true  Transportation Needs: No Transportation Needs (09/09/2023)   PRAPARE - Administrator, Civil Service (Medical): No    Lack of Transportation (Non-Medical): No  Physical Activity: Insufficiently Active (09/09/2023)   Exercise Vital Sign    Days of Exercise per Week: 4 days    Minutes of Exercise per Session: 20 min  Stress: No Stress Concern Present (09/09/2023)   [employer] of Occupational Health - Occupational Stress Questionnaire    Feeling of Stress : Not at all  Social Connections: Moderately Isolated (09/09/2023)   Social Connection and Isolation Panel [NHANES]    Frequency of Communication with Friends and Family: Twice a week    Frequency of Social Gatherings with Friends and Family: Once a week  Attends Religious Services: Never    Active Member of Clubs or Organizations: No    Attends Banker Meetings: Never    Marital Status: Married    No Known Allergies  Outpatient Medications Prior to Visit  Medication Sig Dispense Refill   ascorbic acid (VITAMIN C) 500 MG tablet Take 500 mg by mouth daily.     Biotin 1 MG CAPS Take 1 capsule by mouth daily.     co-enzyme Q-10 30 MG capsule Take 30 mg by mouth daily.     Multiple Vitamin (MULTIVITAMIN WITH MINERALS) TABS tablet Take 1 tablet by mouth daily.     omega-3 fish oil (MAXEPA) 1000 MG CAPS capsule Take 1 capsule by mouth daily.     acetaminophen  (ACETAMINOPHEN  8 HOUR) 650 MG CR tablet Take 650 mg by  mouth every 8 (eight) hours as needed for pain or fever. (Patient not taking: Reported on 12/07/2023)     ibuprofen  (ADVIL ) 200 MG tablet Take 400 mg by mouth every 6 (six) hours as needed for fever or mild pain (pain score 1-3). (Patient not taking: Reported on 12/07/2023)     naproxen  (NAPROSYN ) 500 MG tablet TAKE 1 TABLET BY MOUTH TWICE DAILY WITH A MEAL (Patient not taking: Reported on 12/07/2023) 60 tablet 0   No facility-administered medications prior to visit.     ROS Review of Systems  Constitutional:  Negative for activity change and appetite change.  HENT:  Positive for sneezing. Negative for sinus pressure and sore throat.   Respiratory:  Positive for cough. Negative for chest tightness, shortness of breath and wheezing.   Cardiovascular:  Negative for chest pain and palpitations.  Gastrointestinal:  Negative for abdominal distention, abdominal pain and constipation.  Genitourinary: Negative.   Musculoskeletal: Negative.        See HPI  Psychiatric/Behavioral:  Negative for behavioral problems and dysphoric mood.     Objective:  BP 121/77   Pulse 87   Ht 4\' 11"  (1.499 m)   Wt 183 lb 3.2 oz (83.1 kg)   SpO2 97%   BMI 37.00 kg/m      12/07/2023    3:29 PM 09/09/2023    2:52 PM 08/25/2023    8:30 AM  BP/Weight  Systolic BP 121 116 100  Diastolic BP 77 78 66  Wt. (Lbs) 183.2 194.8   BMI 37 kg/m2 39.34 kg/m2       Physical Exam Constitutional:      Appearance: She is well-developed.  HENT:     Head:     Comments: No tenderness on palpation of sinuses    Right Ear: Tympanic membrane normal.     Left Ear: Tympanic membrane normal.     Mouth/Throat:     Mouth: Mucous membranes are moist.  Cardiovascular:     Rate and Rhythm: Normal rate.     Heart sounds: Normal heart sounds. No murmur heard. Pulmonary:     Effort: Pulmonary effort is normal.     Breath sounds: Normal breath sounds. No wheezing or rales.  Chest:     Chest wall: No tenderness.  Abdominal:      General: Bowel sounds are normal. There is no distension.     Palpations: Abdomen is soft. There is no mass.     Tenderness: There is no abdominal tenderness.  Musculoskeletal:     Right lower leg: No edema.     Left lower leg: No edema.     Comments: Tenderness to palpation of anterior  right shoulder joint and extreme abduction Negative Neer and Hawkins signs  Neurological:     Mental Status: She is alert and oriented to person, place, and time.  Psychiatric:        Mood and Affect: Mood normal.        Latest Ref Rng & Units 09/09/2023    3:47 PM 08/25/2023    6:25 AM 08/24/2023    3:33 AM  CMP  Glucose 70 - 99 mg/dL 86  99    BUN 6 - 24 mg/dL 14  11    Creatinine 9.14 - 1.00 mg/dL 7.82  9.56  2.13   Sodium 134 - 144 mmol/L 142  136    Potassium 3.5 - 5.2 mmol/L 4.2  3.8    Chloride 96 - 106 mmol/L 103  104    CO2 20 - 29 mmol/L 24  24    Calcium 8.7 - 10.2 mg/dL 9.5  8.1    Total Protein 6.0 - 8.5 g/dL 7.1     Total Bilirubin 0.0 - 1.2 mg/dL 0.2     Alkaline Phos 44 - 121 IU/L 65     AST 0 - 40 IU/L 15     ALT 0 - 32 IU/L 13       Lipid Panel     Component Value Date/Time   CHOL 237 (H) 01/14/2022 1518   TRIG 78 01/14/2022 1518   HDL 91 01/14/2022 1518   CHOLHDL 2.6 01/14/2022 1518   LDLCALC 133 (H) 01/14/2022 1518    CBC    Component Value Date/Time   WBC 6.5 09/09/2023 1547   WBC 4.8 08/25/2023 0625   RBC 4.18 09/09/2023 1547   RBC 3.38 (L) 08/25/2023 0625   HGB 12.2 09/09/2023 1547   HCT 37.5 09/09/2023 1547   PLT 286 09/09/2023 1547   MCV 90 09/09/2023 1547   MCH 29.2 09/09/2023 1547   MCH 29.6 08/25/2023 0625   MCHC 32.5 09/09/2023 1547   MCHC 33.3 08/25/2023 0625   RDW 14.8 09/09/2023 1547   LYMPHSABS 2.4 09/09/2023 1547   MONOABS 0.9 08/23/2023 1854   EOSABS 0.1 09/09/2023 1547   BASOSABS 0.1 09/09/2023 1547    Lab Results  Component Value Date   HGBA1C 5.4 09/09/2023       Assessment & Plan Allergic rhinitis with cough Allergic  rhinitis with productive cough, improved with loratadine but cough persists. - Prescribe nasal spray for congestion. - Continue cetirizine for allergy management.  Right shoulder pain due to early arthritis Right shoulder pain likely due to early arthritis, prefers non-invasive management. - Prescribe meloxicam once daily for inflammation. - Refer to physical therapy for range of motion exercises.        Meds ordered this encounter  Medications   meloxicam (MOBIC) 7.5 MG tablet    Sig: Take 1 tablet (7.5 mg total) by mouth daily.    Dispense:  30 tablet    Refill:  1   fluticasone  (FLONASE ) 50 MCG/ACT nasal spray    Sig: Place 2 sprays into both nostrils daily.    Dispense:  16 g    Refill:  1    Follow-up: Return in about 6 weeks (around 01/18/2024) for CPE/ Preventive Health Exam.       Joaquin Mulberry, MD, FAAFP. [facility] [place], [place] [phone]   12/07/2023, 6:12 PM

## 2023-12-23 ENCOUNTER — Encounter (HOSPITAL_COMMUNITY): Payer: Self-pay | Admitting: Emergency Medicine

## 2023-12-23 ENCOUNTER — Other Ambulatory Visit: Payer: Self-pay

## 2023-12-23 ENCOUNTER — Ambulatory Visit (HOSPITAL_COMMUNITY)
Admission: EM | Admit: 2023-12-23 | Discharge: 2023-12-23 | Disposition: A | Discharge status: Home / Self Care | Attending: Family Medicine | Admitting: Family Medicine

## 2023-12-23 DIAGNOSIS — R82998 Other abnormal findings in urine: Secondary | ICD-10-CM

## 2023-12-23 DIAGNOSIS — R109 Unspecified abdominal pain: Secondary | ICD-10-CM | POA: Diagnosis not present

## 2023-12-23 DIAGNOSIS — Z3202 Encounter for pregnancy test, result negative: Secondary | ICD-10-CM

## 2023-12-23 LAB — POCT URINE PREGNANCY: Preg Test, Ur: NEGATIVE

## 2023-12-23 LAB — POCT URINALYSIS DIP (MANUAL ENTRY)
Bilirubin, UA: NEGATIVE
Blood, UA: NEGATIVE
Glucose, UA: NEGATIVE mg/dL
Ketones, POC UA: NEGATIVE mg/dL
Nitrite, UA: NEGATIVE
Protein Ur, POC: NEGATIVE mg/dL
Spec Grav, UA: 1.025 (ref 1.010–1.025)
Urobilinogen, UA: 0.2 U/dL
pH, UA: 6 (ref 5.0–8.0)

## 2023-12-23 MED ORDER — CEPHALEXIN 500 MG PO CAPS: 500.0000 mg | ORAL_CAPSULE | Freq: Two times a day (BID) | ORAL | 0 refills | Status: AC

## 2023-12-23 NOTE — ED Provider Notes (Addendum)
 [facility]    ASSESSMENT & PLAN:  1. Right flank pain   2. Urine leukocytes    Possible UTI; benign abd. Begin: Meds ordered this encounter  Medications   cephALEXin  (KEFLEX ) 500 MG capsule    Sig: Take 1 capsule (500 mg total) by mouth 2 (two) times daily.    Dispense:  10 capsule    Refill:  0   No signs of pyelonephritis. Urine culture sent. Will notify patient of any significant results. Ensure proper hydration. Will follow up with her PCP or here if not showing improvement over the next 48 hours, sooner if needed.  Outlined signs and symptoms indicating need for more acute intervention. Patient verbalized understanding. After Visit Summary given.  SUBJECTIVE:  Amy Frey is a 58 y.o. female who complains of R flank discomfort; x 2 days; stable; also LL abd discomfort only with sexual intercourse (noted 2 d ago). Denies vaginal bleeding and specific urinary symptoms. Denis fever, chills..Denies gross hematuria. Normal PO intake. Normal ambulation. No tx PTA. H/O UTI with similar symptoms. LMP: No LMP recorded. Patient is postmenopausal.  OBJECTIVE:  Vitals:   12/23/23 1006  BP: 117/89  Pulse: 73  Resp: 18  Temp: 98.2 F (36.8 C)  TempSrc: Oral  SpO2: 95%   General appearance: alert; no distress HENT: oropharynx: moist Lungs: unlabored respirations Abdomen: soft, non-tender Back: mild R CVA tenderness Extremities: no edema; symmetrical with no gross deformities Skin: warm and dry Neurologic: normal gait Psychological: alert and cooperative; normal mood and affect  Labs Reviewed  POCT URINALYSIS DIP (MANUAL ENTRY) - Abnormal; Notable for the following components:      Result Value   Leukocytes, UA Trace (*)    All other components within normal limits  URINE CULTURE  POCT URINE PREGNANCY    No Known Allergies  Past Medical History:  Diagnosis Date   Arthritis    Depression    Migraine    Vertigo    Social History    Socioeconomic History   Marital status: Married    Spouse name: Not on file   Number of children: Not on file   Years of education: Not on file   Highest education level: Not on file  Occupational History   Not on file  Tobacco Use   Smoking status: Never   Smokeless tobacco: Never  Vaping Use   Vaping status: Former  Substance and Sexual Activity   Alcohol use: Not Currently    Alcohol/week: 1.0 - 2.0 standard drink of alcohol    Types: 1 - 2 Glasses of wine per week    Comment: somtimes on stressful days or partys   Drug use: Not Currently    Types: Marijuana   Sexual activity: Yes  Other Topics Concern   Not on file  Social History Narrative   Not on file   Social Drivers of Health   Financial Resource Strain: Low Risk  (09/09/2023)   Overall Financial Resource Strain (CARDIA)    Difficulty of Paying Living Expenses: Not very hard  Food Insecurity: No Food Insecurity (09/09/2023)   Hunger Vital Sign    Worried About Running Out of Food in the Last Year: Never true    Ran Out of Food in the Last Year: Never true  Transportation Needs: No Transportation Needs (09/09/2023)   PRAPARE - Administrator, Civil Service (Medical): No    Lack of Transportation (Non-Medical): No  Physical Activity: Insufficiently Active (09/09/2023)   Exercise  Vital Sign    Days of Exercise per Week: 4 days    Minutes of Exercise per Session: 20 min  Stress: No Stress Concern Present (09/09/2023)   [employer] of Occupational Health - Occupational Stress Questionnaire    Feeling of Stress : Not at all  Social Connections: Moderately Isolated (09/09/2023)   Social Connection and Isolation Panel [NHANES]    Frequency of Communication with Friends and Family: Twice a week    Frequency of Social Gatherings with Friends and Family: Once a week    Attends Religious Services: Never    Database administrator or Organizations: No    Attends Banker Meetings: Never     Marital Status: Married  Catering manager Violence: Not At Risk (08/24/2023)   Humiliation, Afraid, Rape, and Kick questionnaire    Fear of Current or Ex-Partner: No    Emotionally Abused: No    Physically Abused: No    Sexually Abused: No   Family History  Problem Relation Age of Onset   Healthy Mother    Heart failure Father         Afton Albright, MD 12/23/23 1606    Afton Albright, MD 12/23/23 1607    Afton Albright, MD 12/23/23 [phone]

## 2023-12-23 NOTE — Discharge Instructions (Signed)
 You have had labs (urine culture) sent today. We will call you with any significant abnormalities or if there is need to begin or change treatment or pursue further follow up.  You may also review your test results online through MyChart. If you do not have a MyChart account, instructions to sign up should be on your discharge paperwork.

## 2023-12-23 NOTE — ED Triage Notes (Signed)
 Right flank pain for 2 days.    Reports left lower abdominal pain with intercourse for the past 2-3 days.    Denies vaginal discharge Denies urinary symptoms  Has not had any medications for symptoms

## 2023-12-24 ENCOUNTER — Ambulatory Visit (HOSPITAL_COMMUNITY): Payer: Self-pay

## 2023-12-24 LAB — URINE CULTURE: Culture: 10000 — AB

## 2024-01-19 ENCOUNTER — Telehealth: Payer: Self-pay | Admitting: Critical Care Medicine

## 2024-01-19 ENCOUNTER — Telehealth: Payer: Self-pay | Admitting: Family Medicine

## 2024-01-19 NOTE — Telephone Encounter (Signed)
 Error

## 2024-01-19 NOTE — Telephone Encounter (Signed)
Contacted pt confirmed appt

## 2024-01-21 ENCOUNTER — Ambulatory Visit: Attending: Family Medicine | Admitting: Family Medicine

## 2024-01-21 ENCOUNTER — Other Ambulatory Visit (HOSPITAL_COMMUNITY)
Admission: RE | Admit: 2024-01-21 | Discharge: 2024-01-21 | Disposition: A | Discharge status: Home / Self Care | Source: Ambulatory Visit | Attending: Family Medicine | Admitting: Family Medicine

## 2024-01-21 ENCOUNTER — Encounter: Payer: Self-pay | Admitting: Family Medicine

## 2024-01-21 VITALS — BP 121/76 | HR 74 | Ht 59.0 in | Wt 184.6 lb

## 2024-01-21 DIAGNOSIS — I83893 Varicose veins of bilateral lower extremities with other complications: Secondary | ICD-10-CM | POA: Diagnosis not present

## 2024-01-21 DIAGNOSIS — E785 Hyperlipidemia, unspecified: Secondary | ICD-10-CM | POA: Diagnosis not present

## 2024-01-21 DIAGNOSIS — Z124 Encounter for screening for malignant neoplasm of cervix: Secondary | ICD-10-CM | POA: Diagnosis present

## 2024-01-21 DIAGNOSIS — Z1211 Encounter for screening for malignant neoplasm of colon: Secondary | ICD-10-CM

## 2024-01-21 DIAGNOSIS — Z0001 Encounter for general adult medical examination with abnormal findings: Secondary | ICD-10-CM

## 2024-01-21 DIAGNOSIS — Z1231 Encounter for screening mammogram for malignant neoplasm of breast: Secondary | ICD-10-CM

## 2024-01-21 NOTE — Patient Instructions (Signed)
 VISIT SUMMARY:  Today, you came in for your annual physical exam. You have been doing a great job incorporating more fruits and vegetables into your diet and exercising daily. We discussed your elevated cholesterol and varicose veins, and I provided recommendations for both. We also reviewed your general health maintenance and planned for routine screenings.  YOUR PLAN:  -HYPERLIPIDEMIA: Hyperlipidemia means you have high levels of cholesterol in your blood. We will repeat your cholesterol test to see if the dietary changes you made have helped. I will follow up with you once we have the results.  -VARICOSE VEINS: Varicose veins are swollen, twisted veins that you can see just under the surface of the skin. They are not currently painful for you, but prolonged standing may contribute to them. I recommend you use compression stockings to help manage them. If they become painful, we may consider referring you to a vascular clinic.  -GENERAL HEALTH MAINTENANCE: You are due for some routine health screenings. We will order a mammogram, perform a Pap smear, and order a colonoscopy. Keep up the good work with your healthy diet and regular exercise.  INSTRUCTIONS:  Please complete the lab work for your cholesterol test and schedule your mammogram, Pap smear, and colonoscopy. Follow up with me once you have completed these tests so we can review the results together.

## 2024-01-21 NOTE — Progress Notes (Signed)
 Subjective:  Patient ID: Amy Frey, female    DOB: 04-25-1966  Age: 58 y.o. MRN: [id number]  CC: Annual Exam and Gynecologic Exam     Discussed the use of AI scribe software for clinical note transcription with the patient, who gave verbal consent to proceed.  History of Present Illness Amy Frey is a 58 year old female who presents for an annual physical exam. She is due for breast cancer, colon cancer and cervical cancer screening. She has recently incorporated more fruits and vegetables into her diet and exercises daily for 30 minutes. Her past medical history includes elevated cholesterol. She has varicose veins, which are not painful, and she has started over-the-counter medication for them. Her occupation involves prolonged standing,.    Past Medical History:  Diagnosis Date   Arthritis    Depression    Migraine    Vertigo     Past Surgical History:  Procedure Laterality Date   APPENDECTOMY      Family History  Problem Relation Age of Onset   Healthy Mother    Heart failure Father     Social History   Socioeconomic History   Marital status: Married    Spouse name: Not on file   Number of children: Not on file   Years of education: Not on file   Highest education level: Not on file  Occupational History   Not on file  Tobacco Use   Smoking status: Never   Smokeless tobacco: Never  Vaping Use   Vaping status: Former  Substance and Sexual Activity   Alcohol use: Not Currently    Alcohol/week: 1.0 - 2.0 standard drink of alcohol    Types: 1 - 2 Glasses of wine per week    Comment: somtimes on stressful days or partys   Drug use: Not Currently    Types: Marijuana   Sexual activity: Yes  Other Topics Concern   Not on file  Social History Narrative   Not on file   Social Drivers of Health   Financial Resource Strain: Low Risk  (09/09/2023)   Overall Financial Resource Strain (CARDIA)    Difficulty of Paying Living Expenses: Not very hard   Food Insecurity: No Food Insecurity (09/09/2023)   Hunger Vital Sign    Worried About Running Out of Food in the Last Year: Never true    Ran Out of Food in the Last Year: Never true  Transportation Needs: No Transportation Needs (09/09/2023)   PRAPARE - Administrator, Civil Service (Medical): No    Lack of Transportation (Non-Medical): No  Physical Activity: Insufficiently Active (09/09/2023)   Exercise Vital Sign    Days of Exercise per Week: 4 days    Minutes of Exercise per Session: 20 min  Stress: No Stress Concern Present (09/09/2023)   [employer] of Occupational Health - Occupational Stress Questionnaire    Feeling of Stress : Not at all  Social Connections: Moderately Isolated (09/09/2023)   Social Connection and Isolation Panel    Frequency of Communication with Friends and Family: Twice a week    Frequency of Social Gatherings with Friends and Family: Once a week    Attends Religious Services: Never    Database administrator or Organizations: No    Attends Banker Meetings: Never    Marital Status: Married    No Known Allergies  Outpatient Medications Prior to Visit  Medication Sig Dispense Refill   acetaminophen  (ACETAMINOPHEN  8 HOUR) 650 MG  CR tablet Take 650 mg by mouth every 8 (eight) hours as needed for pain or fever.     ascorbic acid (VITAMIN C) 500 MG tablet Take 500 mg by mouth daily. (Patient taking differently: Take 1,000 mg by mouth 2 (two) times daily.)     Biotin 1 MG CAPS Take 1 capsule by mouth daily.     co-enzyme Q-10 30 MG capsule Take 30 mg by mouth daily. (Patient taking differently: Take 100 mg by mouth daily.)     ibuprofen  (ADVIL ) 200 MG tablet Take 400 mg by mouth every 6 (six) hours as needed for fever or mild pain (pain score 1-3).     Multiple Vitamin (MULTIVITAMIN WITH MINERALS) TABS tablet Take 1 tablet by mouth daily.     omega-3 fish oil (MAXEPA) 1000 MG CAPS capsule Take 1 capsule by mouth daily. (Patient  taking differently: Take 2,400 mg by mouth 2 (two) times daily.)     cephALEXin  (KEFLEX ) 500 MG capsule Take 1 capsule (500 mg total) by mouth 2 (two) times daily. (Patient not taking: Reported on 01/21/2024) 10 capsule 0   fluticasone  (FLONASE ) 50 MCG/ACT nasal spray Place 2 sprays into both nostrils daily. (Patient not taking: Reported on 01/21/2024) 16 g 1   No facility-administered medications prior to visit.     ROS Review of Systems  Constitutional:  Negative for activity change, appetite change and fatigue.  HENT:  Negative for congestion, sinus pressure and sore throat.   Eyes:  Negative for visual disturbance.  Respiratory:  Negative for cough, chest tightness, shortness of breath and wheezing.   Cardiovascular:  Negative for chest pain and palpitations.  Gastrointestinal:  Negative for abdominal distention, abdominal pain and constipation.  Endocrine: Negative for polydipsia.  Genitourinary:  Negative for dysuria and frequency.  Musculoskeletal:  Negative for arthralgias and back pain.  Skin:  Negative for rash.  Neurological:  Negative for tremors, light-headedness and numbness.  Hematological:  Does not bruise/bleed easily.  Psychiatric/Behavioral:  Negative for agitation and behavioral problems.     Objective:  BP 121/76   Pulse 74   Ht 4' 11 (1.499 m)   Wt 184 lb 9.6 oz (83.7 kg)   SpO2 98%   BMI 37.28 kg/m      01/21/2024   10:01 AM 12/23/2023   10:06 AM 12/07/2023    3:29 PM  BP/Weight  Systolic BP 121 117 121  Diastolic BP 76 89 77  Wt. (Lbs) 184.6  183.2  BMI 37.28 kg/m2  37 kg/m2      Physical Exam Exam conducted with a chaperone present.  Constitutional:      General: She is not in acute distress.    Appearance: She is well-developed. She is not diaphoretic.  HENT:     Head: Normocephalic.     Right Ear: External ear normal.     Left Ear: External ear normal.     Nose: Nose normal.   Eyes:     Conjunctiva/sclera: Conjunctivae normal.      Pupils: Pupils are equal, round, and reactive to light.   Neck:     Vascular: No JVD.   Cardiovascular:     Rate and Rhythm: Normal rate and regular rhythm.     Heart sounds: Normal heart sounds. No murmur heard.    No gallop.  Pulmonary:     Effort: Pulmonary effort is normal. No respiratory distress.     Breath sounds: Normal breath sounds. No wheezing or rales.  Chest:  Chest wall: No tenderness.  Breasts:    Right: Normal. No mass, nipple discharge or tenderness.     Left: Normal. No mass, nipple discharge or tenderness.  Abdominal:     General: Bowel sounds are normal. There is no distension.     Palpations: Abdomen is soft. There is no mass.     Tenderness: There is no abdominal tenderness.     Hernia: There is no hernia in the left inguinal area or right inguinal area.  Genitourinary:    General: Normal vulva.     Pubic Area: No rash.      Labia:        Right: No rash.        Left: No rash.      Vagina: Normal.     Cervix: Normal.     Uterus: Normal.      Adnexa: Right adnexa normal and left adnexa normal.       Right: No tenderness.         Left: No tenderness.     Musculoskeletal:        General: No tenderness. Normal range of motion.     Cervical back: Normal range of motion. No tenderness.  Lymphadenopathy:     Upper Body:     Right upper body: No supraclavicular or axillary adenopathy.     Left upper body: No supraclavicular or axillary adenopathy.   Skin:    General: Skin is warm and dry.     Comments: Varicose veins of both lower extremities   Neurological:     Mental Status: She is alert and oriented to person, place, and time.     Deep Tendon Reflexes: Reflexes are normal and symmetric.        Latest Ref Rng & Units 09/09/2023    3:47 PM 08/25/2023    6:25 AM 08/24/2023    3:33 AM  CMP  Glucose 70 - 99 mg/dL 86  99    BUN 6 - 24 mg/dL 14  11    Creatinine 9.42 - 1.00 mg/dL 9.17  9.34  9.30   Sodium 134 - 144 mmol/L 142  136    Potassium  3.5 - 5.2 mmol/L 4.2  3.8    Chloride 96 - 106 mmol/L 103  104    CO2 20 - 29 mmol/L 24  24    Calcium 8.7 - 10.2 mg/dL 9.5  8.1    Total Protein 6.0 - 8.5 g/dL 7.1     Total Bilirubin 0.0 - 1.2 mg/dL 0.2     Alkaline Phos 44 - 121 IU/L 65     AST 0 - 40 IU/L 15     ALT 0 - 32 IU/L 13       Lipid Panel     Component Value Date/Time   CHOL 237 (H) 01/14/2022 1518   TRIG 78 01/14/2022 1518   HDL 91 01/14/2022 1518   CHOLHDL 2.6 01/14/2022 1518   LDLCALC 133 (H) 01/14/2022 1518    CBC    Component Value Date/Time   WBC 6.5 09/09/2023 1547   WBC 4.8 08/25/2023 0625   RBC 4.18 09/09/2023 1547   RBC 3.38 (L) 08/25/2023 0625   HGB 12.2 09/09/2023 1547   HCT 37.5 09/09/2023 1547   PLT 286 09/09/2023 1547   MCV 90 09/09/2023 1547   MCH 29.2 09/09/2023 1547   MCH 29.6 08/25/2023 0625   MCHC 32.5 09/09/2023 1547   MCHC 33.3 08/25/2023 0625   RDW  14.8 09/09/2023 1547   LYMPHSABS 2.4 09/09/2023 1547   MONOABS 0.9 08/23/2023 1854   EOSABS 0.1 09/09/2023 1547   BASOSABS 0.1 09/09/2023 1547    Lab Results  Component Value Date   HGBA1C 5.4 09/09/2023    The 10-year ASCVD risk score (Arnett DK, et al., 2019) is: 2.4%   Values used to calculate the score:     Age: 17 years     Clincally relevant sex: Female     Is Non-Hispanic African American: No     Diabetic: No     Tobacco smoker: No     Systolic Blood Pressure: 121 mmHg     Is BP treated: No     HDL Cholesterol: 91 mg/dL     Total Cholesterol: 237 mg/dL     Assessment & Plan Annual physical exam with abnormal finding Due for routine health screenings. Maintains healthy diet and regular exercise. - Order mammogram. - Perform Pap smear. - Order colonoscopy. - Encourage continued healthy diet and exercise.  Hyperlipidemia Cholesterol levels elevated in February. Dietary changes made. -10-year ASCVD risk score of 2.4% which is low hence she is not on medications - Order repeat cholesterol test. - Follow up with  results.  Varicose Veins Cosmetically concerning, not painful. Prolonged standing may contribute. - Recommend compression stockings. - Consider referral to vascular clinic if painful.      No orders of the defined types were placed in this encounter.   Follow-up: Return in about 1 year (around 01/20/2025) for CPE/ Preventive Health Exam.       Corrina Sabin, MD, FAAFP. [facility] [place], [place] [phone]   01/21/2024, 10:37 AM

## 2024-01-22 LAB — LP+NON-HDL CHOLESTEROL
Cholesterol, Total: 258 mg/dL — ABNORMAL HIGH (ref 100–199)
HDL: 78 mg/dL (ref >39)
LDL Chol Calc (NIH): 163 mg/dL — ABNORMAL HIGH (ref 0–99)
Total Non-HDL-Chol (LDL+VLDL): 180 mg/dL — ABNORMAL HIGH (ref 0–129)
Triglycerides: 97 mg/dL (ref 0–149)
VLDL Cholesterol Cal: 17 mg/dL (ref 5–40)

## 2024-01-22 LAB — BASIC METABOLIC PANEL WITH GFR
BUN/Creatinine Ratio: 21 (ref 12–28)
BUN: 14 mg/dL (ref 8–27)
CO2: 23 mmol/L (ref 20–29)
Calcium: 9.6 mg/dL (ref 8.7–10.3)
Chloride: 101 mmol/L (ref 96–106)
Creatinine, Ser: 0.68 mg/dL (ref 0.57–1.00)
Glucose: 93 mg/dL (ref 70–99)
Potassium: 4.2 mmol/L (ref 3.5–5.2)
Sodium: 140 mmol/L (ref 134–144)
eGFR: 100 mL/min/{1.73_m2} (ref >59)

## 2024-01-23 ENCOUNTER — Ambulatory Visit: Payer: Self-pay | Admitting: Family Medicine

## 2024-01-23 MED ORDER — ATORVASTATIN CALCIUM 20 MG PO TABS: 20.0000 mg | ORAL_TABLET | Freq: Every day | ORAL | 1 refills | Status: AC

## 2024-01-25 ENCOUNTER — Telehealth: Payer: Self-pay | Admitting: Family Medicine

## 2024-01-25 NOTE — Telephone Encounter (Signed)
 Yes she can.

## 2024-01-25 NOTE — Telephone Encounter (Signed)
 Copied from CRM [id number]. Topic: Clinical - Medical Advice >> Jan 25, 2024  9:37 AM Larissa S wrote:  Reason for CRM: Patient is wanting to know if she can pause taking her cholesterol medication to see if she can get it lowered naturally. She states she has been eating foods that may have caused her cholesterol to be high and she wants to try diet changes to see if it helps. Please contact patient to advise.

## 2024-01-25 NOTE — Telephone Encounter (Signed)
 Patient has been informed.

## 2024-01-25 NOTE — Telephone Encounter (Signed)
 Routing to PCP for review.

## 2024-01-26 LAB — CYTOLOGY - PAP
Comment: NEGATIVE
Diagnosis: NEGATIVE
High risk HPV: NEGATIVE

## 2024-02-01 ENCOUNTER — Other Ambulatory Visit: Payer: Self-pay | Admitting: Family Medicine

## 2024-02-08 ENCOUNTER — Encounter: Payer: Self-pay | Admitting: Pediatrics

## 2024-02-10 ENCOUNTER — Other Ambulatory Visit: Payer: Self-pay | Admitting: Nurse Practitioner

## 2024-02-10 DIAGNOSIS — G5603 Carpal tunnel syndrome, bilateral upper limbs: Secondary | ICD-10-CM

## 2024-02-26 ENCOUNTER — Encounter

## 2024-02-26 ENCOUNTER — Telehealth: Payer: Self-pay

## 2024-02-26 NOTE — Telephone Encounter (Signed)
Unable to reach patient. VM left. Pt to call the office back by 5 PM to have PV rescheduled. Pt made aware that in the event that we do not hear back from them their PV and scheduled procedure will be cancelled.

## 2024-03-18 ENCOUNTER — Encounter: Admitting: Pediatrics

## 2024-04-26 ENCOUNTER — Telehealth: Payer: Self-pay

## 2024-04-26 NOTE — Telephone Encounter (Signed)
 Attempted to contact patient and get her scheduled for a morning appointment and call was not answered.

## 2024-04-26 NOTE — Telephone Encounter (Signed)
 Copied from CRM [id number]. Topic: Clinical - Medical Advice >> Apr 26, 2024  9:46 AM [name] wrote: Reason for CRM: Patient has upcoming appointment for her cholesterol check. She wants to know if this needs to be a fasted lab appointment. If it does need to be fasted she would like to reschedule for a morning time.

## 2024-05-23 ENCOUNTER — Ambulatory Visit: Admitting: Family Medicine

## 2024-05-24 ENCOUNTER — Ambulatory Visit: Admitting: Family Medicine

## 2024-06-13 ENCOUNTER — Ambulatory Visit: Attending: Family Medicine | Admitting: Family Medicine

## 2024-06-13 ENCOUNTER — Encounter: Payer: Self-pay | Admitting: Family Medicine

## 2024-06-13 VITALS — BP 123/79 | HR 76 | Temp 98.2°F | Ht 59.0 in | Wt 171.4 lb

## 2024-06-13 DIAGNOSIS — R42 Dizziness and giddiness: Secondary | ICD-10-CM | POA: Diagnosis not present

## 2024-06-13 DIAGNOSIS — E785 Hyperlipidemia, unspecified: Secondary | ICD-10-CM | POA: Diagnosis not present

## 2024-06-13 DIAGNOSIS — M545 Low back pain, unspecified: Secondary | ICD-10-CM | POA: Diagnosis not present

## 2024-06-13 DIAGNOSIS — M25561 Pain in right knee: Secondary | ICD-10-CM

## 2024-06-13 DIAGNOSIS — M25562 Pain in left knee: Secondary | ICD-10-CM

## 2024-06-13 DIAGNOSIS — G8929 Other chronic pain: Secondary | ICD-10-CM

## 2024-06-13 MED ORDER — MELOXICAM 7.5 MG PO TABS: 7.5000 mg | ORAL_TABLET | Freq: Every day | ORAL | 0 refills | Status: AC

## 2024-06-13 MED ORDER — MECLIZINE HCL 25 MG PO TABS: 25.0000 mg | ORAL_TABLET | Freq: Three times a day (TID) | ORAL | 1 refills | Status: AC | PRN

## 2024-06-13 NOTE — Patient Instructions (Signed)
 VISIT SUMMARY:  During your visit, we discussed your knee pain and dizziness following a fall, as well as your chronic back pain, vertigo, varicose veins, and hypercholesterolemia. We reviewed your symptoms, and I have provided recommendations and prescriptions to help manage your conditions.  YOUR PLAN:  -BACK PAIN: Chronic back pain can be caused by various factors, including arthritis. We have ordered an x-ray of your back to investigate further and prescribed meloxicam  to help reduce inflammation and pain.  -RIGHT KNEE PAIN AFTER FALL: Acute right knee pain with swelling and bruising can occur after a fall. We have ordered an x-ray of your right knee to check for any fractures and prescribed meloxicam  to reduce pain and inflammation. Additionally, I recommend using Voltaren gel to help with the pain.  -VERTIGO AND DIZZINESS: Vertigo is a condition that causes dizziness and a sensation of spinning. We have prescribed meclizine to be taken as needed to help manage your dizziness.  -VARICOSE VEINS OF LOWER EXTREMITIES: Varicose veins are swollen, twisted veins that can cause discomfort. I recommend wearing compression stockings to help reduce the swelling and improve blood flow.  -HYPERCHOLESTEROLEMIA: Hypercholesterolemia is a condition characterized by high levels of cholesterol in the blood. We have opted for dietary management over medication at this time. I have ordered a fasting lipid panel to monitor your cholesterol levels and encourage you to continue with your dietary changes and lifestyle improvements.  INSTRUCTIONS:  Please follow up with the x-rays for your back and right knee as soon as possible. Continue taking the prescribed medications and using the recommended treatments. Schedule a follow-up appointment to review the results of your x-rays and fasting lipid panel.

## 2024-06-13 NOTE — Progress Notes (Signed)
 Subjective:  Patient ID: Amy Frey, female    DOB: 02-28-1966  Age: 58 y.o. MRN: [id number]  CC: Medical Management of Chronic Issues (Stress/Recent fall, having knee pain/Dizzy spells)     Discussed the use of AI scribe software for clinical note transcription with the patient, who gave verbal consent to proceed.  History of Present Illness Amy Frey is a 58 year old female with a history of hyperlipidemia who presents with knee pain and dizziness following a fall.  Knee pain has persisted for one and a half to two weeks after a fall at work due to an uneven floor, resulting in swelling, bruising, and a persistent bump on both knees.  She had a fall 3 months ago and hurt the right knee and a month ago fell again and hurt the left knee.  Discomfort increases with clothing rubbing against the area. No topical treatments have been applied.  Dizziness occurs intermittently, both at work and at home, and is more noticeable when looking down. She has a history of vertigo and uses over-the-counter medications with limited effectiveness. There is no associated tinnitus.  She is experiencing significant stress due to family issues, including a recent death, and work-related stress from holding two jobs. Back pain has been present for one to two months, is severe during certain movements, and does not radiate down her legs. She has been working on improving her diet and exercise routine, resulting in a weight loss of approximately thirteen pounds over the past five months. She is not currently taking atorvastatin , focusing instead on dietary changes.    Past Medical History:  Diagnosis Date   Arthritis    Depression    Migraine    Vertigo     Past Surgical History:  Procedure Laterality Date   APPENDECTOMY      Family History  Problem Relation Age of Onset   Healthy Mother    Heart failure Father     Social History   Socioeconomic History   Marital status: Married     Spouse name: Not on file   Number of children: Not on file   Years of education: Not on file   Highest education level: Not on file  Occupational History   Not on file  Tobacco Use   Smoking status: Never   Smokeless tobacco: Never  Vaping Use   Vaping status: Former  Substance and Sexual Activity   Alcohol use: Not Currently    Alcohol/week: 1.0 - 2.0 standard drink of alcohol    Types: 1 - 2 Glasses of wine per week    Comment: somtimes on stressful days or partys   Drug use: Not Currently    Types: Marijuana   Sexual activity: Yes  Other Topics Concern   Not on file  Social History Narrative   Not on file   Social Drivers of Health   Financial Resource Strain: Low Risk  (09/09/2023)   Overall Financial Resource Strain (CARDIA)    Difficulty of Paying Living Expenses: Not very hard  Food Insecurity: No Food Insecurity (09/09/2023)   Hunger Vital Sign    Worried About Running Out of Food in the Last Year: Never true    Ran Out of Food in the Last Year: Never true  Transportation Needs: No Transportation Needs (09/09/2023)   PRAPARE - Administrator, Civil Service (Medical): No    Lack of Transportation (Non-Medical): No  Physical Activity: Insufficiently Active (09/09/2023)   Exercise Vital Sign  Days of Exercise per Week: 4 days    Minutes of Exercise per Session: 20 min  Stress: No Stress Concern Present (09/09/2023)   [employer] of Occupational Health - Occupational Stress Questionnaire    Feeling of Stress : Not at all  Social Connections: Moderately Isolated (09/09/2023)   Social Connection and Isolation Panel    Frequency of Communication with Friends and Family: Twice a week    Frequency of Social Gatherings with Friends and Family: Once a week    Attends Religious Services: Never    Database Administrator or Organizations: No    Attends Engineer, Structural: Never    Marital Status: Married    No Known Allergies  Outpatient  Medications Prior to Visit  Medication Sig Dispense Refill   acetaminophen  (ACETAMINOPHEN  8 HOUR) 650 MG CR tablet Take 650 mg by mouth every 8 (eight) hours as needed for pain or fever.     ascorbic acid (VITAMIN C) 500 MG tablet Take 500 mg by mouth daily. (Patient taking differently: Take 1,000 mg by mouth 2 (two) times daily.)     Biotin 1 MG CAPS Take 1 capsule by mouth daily.     co-enzyme Q-10 30 MG capsule Take 30 mg by mouth daily. (Patient taking differently: Take 100 mg by mouth daily.)     ibuprofen  (ADVIL ) 200 MG tablet Take 400 mg by mouth every 6 (six) hours as needed for fever or mild pain (pain score 1-3).     Multiple Vitamin (MULTIVITAMIN WITH MINERALS) TABS tablet Take 1 tablet by mouth daily.     omega-3 fish oil (MAXEPA) 1000 MG CAPS capsule Take 1 capsule by mouth daily. (Patient taking differently: Take 2,400 mg by mouth 2 (two) times daily.)     atorvastatin  (LIPITOR) 20 MG tablet Take 1 tablet (20 mg total) by mouth daily. (Patient not taking: Reported on 06/13/2024) 90 tablet 1   cephALEXin  (KEFLEX ) 500 MG capsule Take 1 capsule (500 mg total) by mouth 2 (two) times daily. (Patient not taking: Reported on 06/13/2024) 10 capsule 0   fluticasone  (FLONASE ) 50 MCG/ACT nasal spray Place 2 sprays into both nostrils daily. (Patient not taking: Reported on 06/13/2024) 16 g 1   meloxicam  (MOBIC ) 7.5 MG tablet Take 1 tablet by mouth once daily (Patient not taking: Reported on 06/13/2024) 30 tablet 0   No facility-administered medications prior to visit.     ROS Review of Systems  Constitutional:  Negative for activity change and appetite change.  HENT:  Negative for sinus pressure and sore throat.   Respiratory:  Negative for chest tightness, shortness of breath and wheezing.   Cardiovascular:  Negative for chest pain and palpitations.  Gastrointestinal:  Negative for abdominal distention, abdominal pain and constipation.  Genitourinary: Negative.   Musculoskeletal:         See HPI  Neurological:  Positive for dizziness.  Psychiatric/Behavioral:  Negative for behavioral problems and dysphoric mood.     Objective:  BP 123/79   Pulse 76   Temp 98.2 F (36.8 C) (Oral)   Ht 4' 11 (1.499 m)   Wt 171 lb 6.4 oz (77.7 kg)   SpO2 99%   BMI 34.62 kg/m      06/13/2024    2:13 PM 01/21/2024   10:01 AM 12/23/2023   10:06 AM  BP/Weight  Systolic BP 123 121 117  Diastolic BP 79 76 89  Wt. (Lbs) 171.4 184.6   BMI 34.62 kg/m2 37.28 kg/m2  Physical Exam Constitutional:      Appearance: She is well-developed.  Cardiovascular:     Rate and Rhythm: Normal rate.     Heart sounds: Normal heart sounds. No murmur heard. Pulmonary:     Effort: Pulmonary effort is normal.     Breath sounds: Normal breath sounds. No wheezing or rales.  Chest:     Chest wall: No tenderness.  Abdominal:     General: Bowel sounds are normal. There is no distension.     Palpations: Abdomen is soft. There is no mass.     Tenderness: There is no abdominal tenderness.  Musculoskeletal:        General: No tenderness (No TTP of spine).     Right lower leg: No edema.     Left lower leg: No edema.     Comments: Bruising on anterir knee with prominenceof tibial tubercle bilaterally,  TTP   Neurological:     Mental Status: She is alert and oriented to person, place, and time.  Psychiatric:        Mood and Affect: Mood normal.        Latest Ref Rng & Units 01/21/2024   10:42 AM 09/09/2023    3:47 PM 08/25/2023    6:25 AM  CMP  Glucose 70 - 99 mg/dL 93  86  99   BUN 8 - 27 mg/dL 14  14  11    Creatinine 0.57 - 1.00 mg/dL 9.31  9.17  9.34   Sodium 134 - 144 mmol/L 140  142  136   Potassium 3.5 - 5.2 mmol/L 4.2  4.2  3.8   Chloride 96 - 106 mmol/L 101  103  104   CO2 20 - 29 mmol/L 23  24  24    Calcium  8.7 - 10.3 mg/dL 9.6  9.5  8.1   Total Protein 6.0 - 8.5 g/dL  7.1    Total Bilirubin 0.0 - 1.2 mg/dL  0.2    Alkaline Phos 44 - 121 IU/L  65    AST 0 - 40 IU/L  15    ALT 0  - 32 IU/L  13      Lipid Panel     Component Value Date/Time   CHOL 258 (H) 01/21/2024 1042   TRIG 97 01/21/2024 1042   HDL 78 01/21/2024 1042   CHOLHDL 2.6 01/14/2022 1518   LDLCALC 163 (H) 01/21/2024 1042    CBC    Component Value Date/Time   WBC 6.5 09/09/2023 1547   WBC 4.8 08/25/2023 0625   RBC 4.18 09/09/2023 1547   RBC 3.38 (L) 08/25/2023 0625   HGB 12.2 09/09/2023 1547   HCT 37.5 09/09/2023 1547   PLT 286 09/09/2023 1547   MCV 90 09/09/2023 1547   MCH 29.2 09/09/2023 1547   MCH 29.6 08/25/2023 0625   MCHC 32.5 09/09/2023 1547   MCHC 33.3 08/25/2023 0625   RDW 14.8 09/09/2023 1547   LYMPHSABS 2.4 09/09/2023 1547   MONOABS 0.9 08/23/2023 1854   EOSABS 0.1 09/09/2023 1547   BASOSABS 0.1 09/09/2023 1547    Lab Results  Component Value Date   HGBA1C 5.4 09/09/2023       Assessment & Plan Chronic back pain Chronic back pain for 1-2 months, exacerbated by certain movements. Differential includes arthritis. - Ordered x-ray of the back. - Prescribed meloxicam .  Bilateral knee pain Acute right knee pain with swelling and bruising post-fall. - Ordered x-ray of both knees. - Prescribed meloxicam . - Recommended Voltaren gel.  Vertigo and dizziness Intermittent dizziness possibly related to vertigo. Normal blood pressure. - Prescribed meclizine as needed.  Hypercholesterolemia Opted for dietary management over atorvastatin . Recent weight loss noted. - Ordered fasting lipid panel. - Encouraged dietary management and lifestyle changes.      Meds ordered this encounter  Medications   meclizine (ANTIVERT) 25 MG tablet    Sig: Take 1 tablet (25 mg total) by mouth 3 (three) times daily as needed for dizziness.    Dispense:  30 tablet    Refill:  1   meloxicam  (MOBIC ) 7.5 MG tablet    Sig: Take 1 tablet (7.5 mg total) by mouth daily.    Dispense:  90 tablet    Refill:  0    Follow-up: Return in about 6 months (around 12/11/2024) for Chronic medical  conditions.       Corrina Sabin, MD, FAAFP. [facility] [place], [place] [phone]   06/13/2024, 6:03 PM

## 2025-01-24 ENCOUNTER — Encounter: Admitting: Family Medicine
